# Patient Record
Sex: Female | Born: 1995 | Hispanic: Yes | Marital: Married | Smoking: Never smoker
Health system: Southern US, Community
[De-identification: ages and names within clinical notes are randomized; demographics above are authoritative.]

## PROBLEM LIST (undated history)

## (undated) ENCOUNTER — Inpatient Hospital Stay (HOSPITAL_COMMUNITY): Payer: Self-pay

## (undated) DIAGNOSIS — K2970 Gastritis, unspecified, without bleeding: Secondary | ICD-10-CM

## (undated) DIAGNOSIS — N83209 Unspecified ovarian cyst, unspecified side: Secondary | ICD-10-CM

## (undated) DIAGNOSIS — Z8759 Personal history of other complications of pregnancy, childbirth and the puerperium: Secondary | ICD-10-CM

## (undated) DIAGNOSIS — O99820 Streptococcus B carrier state complicating pregnancy: Secondary | ICD-10-CM

## (undated) HISTORY — PX: NO PAST SURGERIES: SHX2092

---
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
## 2017-07-31 ENCOUNTER — Encounter (HOSPITAL_COMMUNITY): Payer: Self-pay | Admitting: Emergency Medicine

## 2017-07-31 ENCOUNTER — Emergency Department (HOSPITAL_COMMUNITY)
Admission: EM | Admit: 2017-07-31 | Discharge: 2017-07-31 | Disposition: A | Discharge status: Home / Self Care | Payer: Medicaid Other | Attending: Emergency Medicine | Admitting: Emergency Medicine

## 2017-07-31 DIAGNOSIS — K2900 Acute gastritis without bleeding: Secondary | ICD-10-CM | POA: Diagnosis not present

## 2017-07-31 DIAGNOSIS — R1013 Epigastric pain: Secondary | ICD-10-CM | POA: Diagnosis present

## 2017-07-31 LAB — URINALYSIS, ROUTINE W REFLEX MICROSCOPIC
BILIRUBIN URINE: NEGATIVE
Glucose, UA: NEGATIVE mg/dL
HGB URINE DIPSTICK: NEGATIVE
Ketones, ur: NEGATIVE mg/dL
Nitrite: NEGATIVE
PH: 8 (ref 5.0–8.0)
PROTEIN: 30 mg/dL — AB
SPECIFIC GRAVITY, URINE: 1.02 (ref 1.005–1.030)

## 2017-07-31 LAB — I-STAT CHEM 8, ED
BUN: 7 mg/dL (ref 6–20)
CREATININE: 0.6 mg/dL (ref 0.44–1.00)
Calcium, Ion: 1.23 mmol/L (ref 1.15–1.40)
Chloride: 104 mmol/L (ref 101–111)
GLUCOSE: 86 mg/dL (ref 65–99)
HCT: 41 % (ref 36.0–46.0)
HEMOGLOBIN: 13.9 g/dL (ref 12.0–15.0)
POTASSIUM: 4.1 mmol/L (ref 3.5–5.1)
Sodium: 140 mmol/L (ref 135–145)
TCO2: 25 mmol/L (ref 22–32)

## 2017-07-31 LAB — I-STAT BETA HCG BLOOD, ED (MC, WL, AP ONLY)

## 2017-07-31 LAB — CBC
HCT: 40.3 % (ref 36.0–46.0)
Hemoglobin: 13.3 g/dL (ref 12.0–15.0)
MCH: 28.8 pg (ref 26.0–34.0)
MCHC: 33 g/dL (ref 30.0–36.0)
MCV: 87.2 fL (ref 78.0–100.0)
PLATELETS: 220 10*3/uL (ref 150–400)
RBC: 4.62 MIL/uL (ref 3.87–5.11)
RDW: 12.4 % (ref 11.5–15.5)
WBC: 6.7 10*3/uL (ref 4.0–10.5)

## 2017-07-31 LAB — COMPREHENSIVE METABOLIC PANEL
ALK PHOS: 65 U/L (ref 38–126)
ALT: 16 U/L (ref 14–54)
AST: 18 U/L (ref 15–41)
Albumin: 4 g/dL (ref 3.5–5.0)
Anion gap: 6 (ref 5–15)
BUN: 7 mg/dL (ref 6–20)
CALCIUM: 9.1 mg/dL (ref 8.9–10.3)
CHLORIDE: 105 mmol/L (ref 101–111)
CO2: 26 mmol/L (ref 22–32)
CREATININE: 0.58 mg/dL (ref 0.44–1.00)
GFR calc Af Amer: >60 mL/min (ref >60)
GFR calc non Af Amer: >60 mL/min (ref >60)
Glucose, Bld: 92 mg/dL (ref 65–99)
Potassium: 4.2 mmol/L (ref 3.5–5.1)
SODIUM: 137 mmol/L (ref 135–145)
Total Bilirubin: 0.6 mg/dL (ref 0.3–1.2)
Total Protein: 6.9 g/dL (ref 6.5–8.1)

## 2017-07-31 LAB — LIPASE, BLOOD: LIPASE: 36 U/L (ref 11–51)

## 2017-07-31 MED ORDER — GI COCKTAIL ~~LOC~~
30.0000 mL | Freq: Once | ORAL | Status: AC
  Administered 2017-07-31: 30 mL via ORAL
  Filled 2017-07-31: qty 30

## 2017-07-31 MED ORDER — ONDANSETRON HCL 4 MG PO TABS: 4.0000 mg | ORAL_TABLET | Freq: Four times a day (QID) | ORAL | 0 refills | Status: DC

## 2017-07-31 MED ORDER — OMEPRAZOLE 20 MG PO CPDR: 20.0000 mg | DELAYED_RELEASE_CAPSULE | Freq: Every day | ORAL | 0 refills | Status: DC

## 2017-07-31 NOTE — Discharge Instructions (Signed)
Please read attached information. If you experience any new or worsening signs or symptoms please return to the emergency room for evaluation. Please follow-up with your primary care provider or specialist as discussed. Please use medication prescribed only as directed and discontinue taking if you have any concerning signs or symptoms.   °

## 2017-07-31 NOTE — ED Provider Notes (Signed)
[facility] Provider Note   CSN: [id number] Arrival date & time: 07/31/17  1241     History   Chief Complaint Chief Complaint  Patient presents with  . Abdominal Pain  . Emesis    HPI Illene SilverChabelly Pearson is a 21 y.o. female.  HPI    21 year old female presents today with complaints of abdominal pain.  Patient reports for the last 3 days she has had epigastric abdominal pain.  She reports this is worse after eating, and also worse when she does need for prolonged periods of time.  She notes this has no radiation of symptoms, reports several episodes of vomiting, none since presentation to the emergency room.  She denies any lower abdominal pain, denies any drug or alcohol use, not taking any medications, denies any fever or systemic illnesses.  No history of the same.      History reviewed. No pertinent past medical history.  There are no active problems to display for this patient.   History reviewed. No pertinent surgical history.  OB History    No data available       Home Medications    Prior to Admission medications   Medication Sig Start Date End Date Taking? Authorizing Provider  omeprazole (PRILOSEC) 20 MG capsule Take 1 capsule (20 mg total) by mouth daily. 07/31/17   Deklin Bieler, Tinnie GensJeffrey, PA-C  ondansetron (ZOFRAN) 4 MG tablet Take 1 tablet (4 mg total) by mouth every 6 (six) hours. 07/31/17   Eyvonne MechanicHedges, Clem Wisenbaker, PA-C    Family History No family history on file.  Social History Social History  Substance Use Topics  . Smoking status: Never Smoker  . Smokeless tobacco: Never Used  . Alcohol use Yes     Comment: occ     Allergies   Patient has no known allergies.   Review of Systems Review of Systems  All other systems reviewed and are negative.    Physical Exam Updated Vital Signs BP 117/71   Pulse 76   Temp 98.5 F (36.9 C) (Oral)   LMP 07/21/2017 (Exact Date) Comment: pt rpeorts implanted birth contraol  removed 07/11/17, only spotting since  SpO2 100%   Physical Exam  Constitutional: She is oriented to person, place, and time. She appears well-developed and well-nourished.  HENT:  Head: Normocephalic and atraumatic.  Eyes: Pupils are equal, round, and reactive to light. Conjunctivae are normal. Right eye exhibits no discharge. Left eye exhibits no discharge. No scleral icterus.  Neck: Normal range of motion. No JVD present. No tracheal deviation present.  Pulmonary/Chest: Effort normal. No stridor.  Abdominal:  Epigastric tenderness to palpation, no right upper quadrant tenderness, remainder abdominal exam  Neurological: She is alert and oriented to person, place, and time. Coordination normal.  Psychiatric: She has a normal mood and affect. Her behavior is normal. Judgment and thought content normal.  Nursing note and vitals reviewed.    ED Treatments / Results  Labs (all labs ordered are listed, but only abnormal results are displayed) Labs Reviewed  URINALYSIS, ROUTINE W REFLEX MICROSCOPIC - Abnormal; Notable for the following:       Result Value   Protein, ur 30 (*)    Leukocytes, UA TRACE (*)    Bacteria, UA FEW (*)    Squamous Epithelial / LPF 0-5 (*)    All other components within normal limits  LIPASE, BLOOD  COMPREHENSIVE METABOLIC PANEL  CBC  I-STAT BETA HCG BLOOD, ED ([facility], [facility], [facility] ONLY)  I-STAT CHEM  8, ED    EKG  EKG Interpretation None       Radiology No results found.  Procedures Procedures (including critical care time)  Medications Ordered in ED Medications  gi cocktail (Maalox,Lidocaine,Donnatal) (30 mLs Oral Given 07/31/17 1558)     Initial Impression / Assessment and Plan / ED Course  I have reviewed the triage vital signs and the nursing notes.  Pertinent labs & imaging results that were available during my care of the patient were reviewed by me and considered in my medical decision making (see chart for details).    Labs:    Imaging:  Consults:  Therapeutics:  Discharge Meds:   Assessment/Plan: 21 year old female presents today with abdominal pain.  This is likely gastritis, question gastric ulcer in this patient.  She is well-appearing resting comfortably in exam bed.  She was given GI cocktail which improved her symptoms.  She has reassuring laboratory analysis, no right upper quadrant pain, low suspicion for colecystitis, cholelithiasis, or any other significant intra-abdominal pathology.  Patient will be started on PPI, she will follow-up as an outpatient with her primary care for ongoing evaluation and management.   Final Clinical Impressions(s) / ED Diagnoses   Final diagnoses:  Acute gastritis without hemorrhage, unspecified gastritis type    New Prescriptions New Prescriptions   OMEPRAZOLE (PRILOSEC) 20 MG CAPSULE    Take 1 capsule (20 mg total) by mouth daily.   ONDANSETRON (ZOFRAN) 4 MG TABLET    Take 1 tablet (4 mg total) by mouth every 6 (six) hours.     Eyvonne Mechanic, PA-C 07/31/17 1746    Bethann Berkshire, MD 08/01/17 [phone]

## 2017-07-31 NOTE — ED Triage Notes (Signed)
Pt c/o dizziness since MOnday, states, "I feel dizzy all the time, like I'm pregnant."  Pt reports light spotting, last normal menstruation in September.

## 2017-07-31 NOTE — ED Triage Notes (Signed)
Pt reports epigastric abdominal pain since Monday with emesis that began 2 days ago. Denies diarrhea, fevers, chills, urinary symptoms. 1 episode of emesis today.

## 2017-08-04 ENCOUNTER — Encounter: Payer: Self-pay | Admitting: Physician Assistant

## 2017-08-14 ENCOUNTER — Ambulatory Visit (INDEPENDENT_AMBULATORY_CARE_PROVIDER_SITE_OTHER): Payer: Medicaid Other | Admitting: Physician Assistant

## 2017-08-14 ENCOUNTER — Encounter: Payer: Self-pay | Admitting: Physician Assistant

## 2017-08-14 VITALS — BP 100/50 | HR 80 | Ht 64.5 in | Wt 172.0 lb

## 2017-08-14 DIAGNOSIS — R1013 Epigastric pain: Secondary | ICD-10-CM

## 2017-08-14 DIAGNOSIS — R112 Nausea with vomiting, unspecified: Secondary | ICD-10-CM

## 2017-08-14 MED ORDER — OMEPRAZOLE 20 MG PO CPDR: 20.0000 mg | DELAYED_RELEASE_CAPSULE | Freq: Every day | ORAL | 0 refills | Status: DC

## 2017-08-14 NOTE — Patient Instructions (Signed)
We have sent the following medications to your pharmacy for you to pick up at your convenience: Omeprazole 20 mg daily for one month

## 2017-08-14 NOTE — Progress Notes (Signed)
Agree with assessment and plan as outlined.  

## 2017-08-14 NOTE — Progress Notes (Signed)
Chief Complaint: Epigastric pain, nausea and vomiting  HPI:    [name] is a 21 year old female with no pertinent past medical history who was recently seen in the ER for acute gastritis and presents to clinic today as a new patient for follow-up.    Per review of chart patient was seen in the ER 07/31/17 with abdominal pain which was thought likely to be gastritis.  She improved with a GI cocktail and was given Omeprazole 20 mg once daily for a week as well as Zofran.  Labs at that time showed a normal lipase, CMP and CBC.    Today, the patient presents to clinic and explains that she took the medicine once a day for a week and now feels completely better over the past 3 days.  Patient tells me that prior to this about a month ago she started with some epigastric pain as well as nausea and some vomiting.  Patient also told me that she did feel "very full after I ate".  At this point in time patient is a longer having any symptoms. She denies NSAID use and is on a regular diet.    Patient denies fever, chills, blood in her stool, melena, weight loss, fatigue, anorexia, heartburn or reflux.  History reviewed. No pertinent past medical history.  Past Surgical History:  Procedure Laterality Date  . NO PAST SURGERIES      Current Outpatient Medications  Medication Sig Dispense Refill  . omeprazole (PRILOSEC) 20 MG capsule Take 1 capsule (20 mg total) daily by mouth. 30 capsule 0   No current facility-administered medications for this visit.     Allergies as of 08/14/2017  . (No Known Allergies)    History reviewed. No pertinent family history.  Social History   Socioeconomic History  . Marital status: Married    Spouse name: Not on file  . Number of children: 1  . Years of education: Not on file  . Highest education level: Not on file  Social Needs  . Financial resource strain: Not on file  . Food insecurity - worry: Not on file  . Food insecurity - inability: Not on file  .  Transportation needs - medical: Not on file  . Transportation needs - non-medical: Not on file  Occupational History  . Not on file  Tobacco Use  . Smoking status: Never Smoker  . Smokeless tobacco: Never Used  Substance and Sexual Activity  . Alcohol use: No    Frequency: Never  . Drug use: No  . Sexual activity: Not on file  Other Topics Concern  . Not on file  Social History Narrative  . Not on file    Review of Systems:    Constitutional: No weight loss, fever or chills Skin: No rash Cardiovascular: No chest pain  Respiratory: No SOB  Gastrointestinal: See HPI and otherwise negative Genitourinary: No dysuria Neurological: No headache Musculoskeletal: No new muscle or joint pain Hematologic: No bleeding  Psychiatric: No history of depression or anxiety   Physical Exam:  Vital signs: BP (!) 100/50 (BP Location: Left Arm, Patient Position: Sitting, Cuff Size: Normal)   Pulse 80   Ht 5' 4.5" (1.638 m) Comment: height measured without shoes  Wt 172 lb (78 kg)   LMP 07/21/2017 (Exact Date) Comment: pt reports implanted birth control removed 07/11/17, only spotting since  BMI 29.07 kg/m   Constitutional:   Pleasant female appears to be in NAD, Well developed, Well nourished, alert and cooperative Head:  Normocephalic and atraumatic. Eyes:   PEERL, EOMI. No icterus. Conjunctiva pink. Ears:  Normal auditory acuity. Neck:  Supple Throat: Oral cavity and pharynx without inflammation, swelling or lesion.  Respiratory: Respirations even and unlabored. Lungs clear to auscultation bilaterally.   No wheezes, crackles, or rhonchi.  Cardiovascular: Normal S1, S2. No MRG. Regular rate and rhythm. No peripheral edema, cyanosis or pallor.  Gastrointestinal:  Soft, nondistended, mild epigastric ttp,. No rebound or guarding. Normal bowel sounds. No appreciable masses or hepatomegaly. Rectal:  Not performed.  Msk:  Symmetrical without gross deformities. Without edema, no deformity or  joint abnormality.  Neurologic:  Alert and  oriented x4;  grossly normal neurologically.  Skin:   Dry and intact without significant lesions or rashes. Psychiatric: Demonstrates good judgement and reason without abnormal affect or behaviors.  RELEVANT LABS AND IMAGING: CBC    Component Value Date/Time   WBC 6.7 07/31/2017 1256   RBC 4.62 07/31/2017 1256   HGB 13.9 07/31/2017 1325   HCT 41.0 07/31/2017 1325   PLT 220 07/31/2017 1256   MCV 87.2 07/31/2017 1256   MCH 28.8 07/31/2017 1256   MCHC 33.0 07/31/2017 1256   RDW 12.4 07/31/2017 1256    CMP     Component Value Date/Time   NA 140 07/31/2017 1325   K 4.1 07/31/2017 1325   CL 104 07/31/2017 1325   CO2 26 07/31/2017 1256   GLUCOSE 86 07/31/2017 1325   BUN 7 07/31/2017 1325   CREATININE 0.60 07/31/2017 1325   CALCIUM 9.1 07/31/2017 1256   PROT 6.9 07/31/2017 1256   ALBUMIN 4.0 07/31/2017 1256   AST 18 07/31/2017 1256   ALT 16 07/31/2017 1256   ALKPHOS 65 07/31/2017 1256   BILITOT 0.6 07/31/2017 1256   GFRNONAA >60 07/31/2017 1256   GFRAA >60 07/31/2017 1256    Assessment: 1.  Epigastric pain: Continues today on exam, the patient tells me she has had no further symptoms for the past 3 days, these resolved after a week of Omeprazole 20 mg once daily; most likely this represented acute gastritis 2.  Nausea and vomiting: Resolved, likely due to above  Plan: 1.  Discussed etiology of symptoms with the patient.  Reviewed an antireflux diet and lifestyle modifications. 2.  Due to continued abdominal pain on exam today, prescribed 1 month of Omeprazole 20 mg once daily.  Patient may that then discontinue. 3.  Patient to follow in clinic as needed in the future.  She was assigned to Dr. Adela LankArmbruster today.  Hyacinth MeekerJennifer Zaire Vanbuskirk, PA-C [facility] 08/14/2017, 1:41 PM  Cc: No ref. provider found

## 2017-09-10 DIAGNOSIS — Z23 Encounter for immunization: Secondary | ICD-10-CM | POA: Diagnosis not present

## 2017-09-10 DIAGNOSIS — Z3009 Encounter for other general counseling and advice on contraception: Secondary | ICD-10-CM | POA: Diagnosis not present

## 2018-01-23 ENCOUNTER — Encounter (HOSPITAL_COMMUNITY): Payer: Self-pay

## 2018-01-23 ENCOUNTER — Emergency Department (HOSPITAL_COMMUNITY)
Admission: EM | Admit: 2018-01-23 | Discharge: 2018-01-24 | Disposition: A | Discharge status: Home / Self Care | Payer: Medicaid Other | Attending: Emergency Medicine | Admitting: Emergency Medicine

## 2018-01-23 DIAGNOSIS — X102XXA Contact with fats and cooking oils, initial encounter: Secondary | ICD-10-CM | POA: Insufficient documentation

## 2018-01-23 DIAGNOSIS — Y92000 Kitchen of unspecified non-institutional (private) residence as  the place of occurrence of the external cause: Secondary | ICD-10-CM | POA: Insufficient documentation

## 2018-01-23 DIAGNOSIS — Y999 Unspecified external cause status: Secondary | ICD-10-CM | POA: Insufficient documentation

## 2018-01-23 DIAGNOSIS — T23262A Burn of second degree of back of left hand, initial encounter: Secondary | ICD-10-CM | POA: Insufficient documentation

## 2018-01-23 DIAGNOSIS — Y93G3 Activity, cooking and baking: Secondary | ICD-10-CM | POA: Insufficient documentation

## 2018-01-23 MED ORDER — FENTANYL CITRATE (PF) 100 MCG/2ML IJ SOLN
50.0000 ug | INTRAMUSCULAR | Status: DC | PRN
  Administered 2018-01-23: 50 ug via NASAL
  Filled 2018-01-23: qty 2
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
## 2018-01-24 MED ORDER — ONDANSETRON 4 MG PO TBDP
4.0000 mg | ORAL_TABLET | Freq: Once | ORAL | Status: AC
  Administered 2018-01-24: 4 mg via ORAL
  Filled 2018-01-24: qty 1

## 2018-01-24 MED ORDER — SILVER SULFADIAZINE 1 % EX CREA
TOPICAL_CREAM | Freq: Once | CUTANEOUS | Status: DC
  Filled 2018-01-24: qty 85

## 2018-01-24 MED ORDER — BACITRACIN ZINC 500 UNIT/GM EX OINT
TOPICAL_OINTMENT | Freq: Once | CUTANEOUS | Status: AC
  Administered 2018-01-24: 1 via TOPICAL

## 2018-01-24 MED ORDER — DOXYCYCLINE HYCLATE 100 MG PO CAPS: 100.0000 mg | ORAL_CAPSULE | Freq: Two times a day (BID) | ORAL | 0 refills | Status: DC

## 2018-01-24 MED ORDER — OXYCODONE-ACETAMINOPHEN 5-325 MG PO TABS: 1.0000 | ORAL_TABLET | Freq: Four times a day (QID) | ORAL | 0 refills | Status: DC | PRN

## 2018-01-24 MED ORDER — BACITRACIN ZINC 500 UNIT/GM EX OINT: 1.0000 "application " | TOPICAL_OINTMENT | Freq: Two times a day (BID) | CUTANEOUS | 0 refills | Status: DC

## 2018-01-24 MED ORDER — IBUPROFEN 600 MG PO TABS: 600.0000 mg | ORAL_TABLET | Freq: Four times a day (QID) | ORAL | 0 refills | Status: DC | PRN

## 2018-01-24 MED ORDER — OXYCODONE-ACETAMINOPHEN 5-325 MG PO TABS
1.0000 | ORAL_TABLET | Freq: Once | ORAL | Status: AC
Start: 2018-01-24 — End: 2018-01-24
  Administered 2018-01-24: 1 via ORAL
  Filled 2018-01-24: qty 1

## 2018-01-24 MED ORDER — ONDANSETRON HCL 4 MG PO TABS: 4.0000 mg | ORAL_TABLET | Freq: Four times a day (QID) | ORAL | 0 refills | Status: DC

## 2018-01-24 NOTE — Progress Notes (Signed)
Orthopedic Tech Progress Note Patient Details:  Hannah Pearson 21-Jan-1996 [id number]  Ortho Devices Type of Ortho Device: Thumb velcro splint Ortho Device/Splint Location: delivered to pt. dr didnt want it applied. Ortho Device/Splint Interventions: Rich Brave 01/24/2018, 2:08 AM

## 2018-01-24 NOTE — Discharge Instructions (Addendum)
Medications: Percocet, ibuprofen, doxycycline, bacitracin  Treatment: Take 1 Percocet every 6 hours as needed for severe pain.  Make sure to take this with food.  Take Zofran every 6 hours as needed for nausea or vomiting.  Take ibuprofen every 6 hours as needed for mild to moderate pain.  Take doxycycline twice daily as prescribed.  Apply bacitracin and new dressing twice daily after washing your burn with soapy water.  You can use the splint for comfort as needed.  Do not drink alcohol, drive, operate machinery or participate in any other potentially dangerous activities while taking opiate pain medication as it may make you sleepy. Do not take this medication with any other sedating medications, either prescription or over-the-counter. If you were prescribed Percocet or Vicodin, do not take these with acetaminophen (Tylenol) as it is already contained within these medications and overdose of Tylenol is dangerous.   This medication is an opiate (or narcotic) pain medication and can be habit forming.  Use it as little as possible to achieve adequate pain control.  Do not use or use it with extreme caution if you have a history of opiate abuse or dependence. This medication is intended for your use only - do not give any to anyone else and keep it in a secure place where nobody else, especially children, have access to it. It will also cause or worsen constipation, so you may want to consider taking an over-the-counter stool softener while you are taking this medication.  Follow-up: Please follow-up with Dr. Amanda Pea at your scheduled appointment on Monday, 01/26/2018, at 2:30 PM.  Please return to the emergency department if you develop any new or worsening symptoms.
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
## 2018-07-05 ENCOUNTER — Encounter: Payer: Self-pay | Admitting: Emergency Medicine

## 2018-07-05 ENCOUNTER — Emergency Department (HOSPITAL_COMMUNITY)
Admission: EM | Admit: 2018-07-05 | Discharge: 2018-07-05 | Disposition: A | Discharge status: Home / Self Care | Payer: Self-pay | Attending: Emergency Medicine | Admitting: Emergency Medicine

## 2018-07-05 DIAGNOSIS — O9989 Other specified diseases and conditions complicating pregnancy, childbirth and the puerperium: Secondary | ICD-10-CM | POA: Insufficient documentation

## 2018-07-05 DIAGNOSIS — Z3A01 Less than 8 weeks gestation of pregnancy: Secondary | ICD-10-CM | POA: Insufficient documentation

## 2018-07-05 DIAGNOSIS — O219 Vomiting of pregnancy, unspecified: Secondary | ICD-10-CM | POA: Insufficient documentation

## 2018-07-05 DIAGNOSIS — R51 Headache: Secondary | ICD-10-CM | POA: Insufficient documentation

## 2018-07-05 DIAGNOSIS — R519 Headache, unspecified: Secondary | ICD-10-CM

## 2018-07-05 LAB — COMPREHENSIVE METABOLIC PANEL
ALT: 14 U/L (ref 0–44)
AST: 17 U/L (ref 15–41)
Albumin: 3.7 g/dL (ref 3.5–5.0)
Alkaline Phosphatase: 52 U/L (ref 38–126)
Anion gap: 7 (ref 5–15)
BUN: 5 mg/dL — ABNORMAL LOW (ref 6–20)
CHLORIDE: 105 mmol/L (ref 98–111)
CO2: 23 mmol/L (ref 22–32)
Calcium: 9.3 mg/dL (ref 8.9–10.3)
Creatinine, Ser: 0.56 mg/dL (ref 0.44–1.00)
Glucose, Bld: 89 mg/dL (ref 70–99)
POTASSIUM: 4.1 mmol/L (ref 3.5–5.1)
Sodium: 135 mmol/L (ref 135–145)
Total Bilirubin: 0.4 mg/dL (ref 0.3–1.2)
Total Protein: 6.6 g/dL (ref 6.5–8.1)

## 2018-07-05 LAB — CBC WITH DIFFERENTIAL/PLATELET
Abs Immature Granulocytes: 0.1 10*3/uL (ref 0.0–0.1)
Basophils Absolute: 0.1 10*3/uL (ref 0.0–0.1)
Basophils Relative: 1 %
EOS ABS: 0.1 10*3/uL (ref 0.0–0.7)
Eosinophils Relative: 1 %
HEMATOCRIT: 41.4 % (ref 36.0–46.0)
HEMOGLOBIN: 13.2 g/dL (ref 12.0–15.0)
Immature Granulocytes: 1 %
LYMPHS ABS: 1.9 10*3/uL (ref 0.7–4.0)
LYMPHS PCT: 19 %
MCH: 28.7 pg (ref 26.0–34.0)
MCHC: 31.9 g/dL (ref 30.0–36.0)
MCV: 90 fL (ref 78.0–100.0)
Monocytes Absolute: 0.7 10*3/uL (ref 0.1–1.0)
Monocytes Relative: 7 %
Neutro Abs: 7.2 10*3/uL (ref 1.7–7.7)
Neutrophils Relative %: 71 %
Platelets: 219 10*3/uL (ref 150–400)
RBC: 4.6 MIL/uL (ref 3.87–5.11)
RDW: 13.2 % (ref 11.5–15.5)
WBC: 10 10*3/uL (ref 4.0–10.5)

## 2018-07-05 MED ORDER — ACETAMINOPHEN 500 MG PO TABS
1000.0000 mg | ORAL_TABLET | Freq: Once | ORAL | Status: AC
  Administered 2018-07-05: 1000 mg via ORAL
  Filled 2018-07-05: qty 2

## 2018-07-05 MED ORDER — MECLIZINE HCL 25 MG PO TABS: 12.5000 mg | ORAL_TABLET | Freq: Two times a day (BID) | ORAL | 0 refills | Status: DC | PRN

## 2018-07-05 MED ORDER — CONCEPT OB 130-92.4-1 MG PO CAPS: 1.0000 | ORAL_CAPSULE | Freq: Every day | ORAL | 12 refills | Status: DC

## 2018-07-05 MED ORDER — SODIUM CHLORIDE 0.9 % IV BOLUS
1000.0000 mL | Freq: Once | INTRAVENOUS | Status: AC
  Administered 2018-07-05: 1000 mL via INTRAVENOUS

## 2018-07-05 MED ORDER — DIPHENHYDRAMINE HCL 50 MG/ML IJ SOLN
12.5000 mg | Freq: Once | INTRAMUSCULAR | Status: AC
  Administered 2018-07-05: 12.5 mg via INTRAVENOUS
  Filled 2018-07-05: qty 1

## 2018-07-05 MED ORDER — METOCLOPRAMIDE HCL 5 MG/ML IJ SOLN
5.0000 mg | Freq: Once | INTRAMUSCULAR | Status: AC
  Administered 2018-07-05: 5 mg via INTRAVENOUS
  Filled 2018-07-05: qty 2

## 2018-07-05 NOTE — ED Notes (Signed)
Patient verbalizes understanding of discharge instructions. Opportunity for questioning and answers were provided. Armband removed by staff, pt discharged from ED ambulatory.   

## 2018-07-05 NOTE — ED Triage Notes (Signed)
Pt reports headache x1 week, has not taken any medication because she is [redacted] weeks pregnant and unsure what she could safely take for pain. A/ox4, resp e/u, nad.

## 2018-07-05 NOTE — Discharge Instructions (Addendum)
Tylenol is the only safe over the counter medication for you to take during pregnancy for pain and headache. Please make sure to take prenatal vitamins at night before bed.  Please follow up closely with an obstetrician.   RETURN IMMEDIATELY IF you develop a sudden, severe headache or confusion, become poorly responsive or faint, develop a fever above 100.93F or problem breathing, have a change in speech, vision, swallowing, or understanding, or develop new weakness, numbness, tingling, incoordination, or have a seizure. Marland Kitchen

## 2018-07-05 NOTE — ED Provider Notes (Signed)
[facility] EMERGENCY DEPARTMENT Provider Note   CSN: [id number] Arrival date & time: 07/05/18  0940     History   Chief Complaint Chief Complaint  Patient presents with  . Headache    HPI Hannah Pearson is a 22 y.o. female.  Who presents the emergency department with chief complaint of headache and dizziness.  The patient states that she is approximately [redacted] weeks pregnant.  3 weeks ago she found out she was pregnant.  She has had multiple episodes of vomiting which are worsened after eating daily.  She is having symptoms of reflux.  She has had a daily intermittent aching global headache without photophobia, phonophobia.  She has no history of headaches previously.  She has not taken anything for the headache because she was not sure what she was able to take during pregnancy.  She is also complaining of dizziness when she moves her eyes.  She feels like the room is spinning.  She denies lightheadedness or orthostatic symptoms.  She has never had a previous diagnosis of vertigo.  She denies any other neurologic complaints at this time including unilateral weakness, difficulty with speech or swallowing, changes in vision, disequilibrium or ataxia.  The history is provided by the patient and a relative. The history is limited by a language barrier. A language interpreter was used.    History reviewed. No pertinent past medical history.  There are no active problems to display for this patient.   Past Surgical History:  Procedure Laterality Date  . NO PAST SURGERIES       OB History    Gravida  1   Para      Term      Preterm      AB      Living        SAB      TAB      Ectopic      Multiple      Live Births               Home Medications    Prior to Admission medications   Medication Sig Start Date End Date Taking? Authorizing Provider  prenatal vitamin w/FE, FA (PRENATAL 1 + 1) 27-1 MG TABS tablet Take 1 tablet by mouth daily.    Yes  [provider]  bacitracin ointment Apply 1 application topically 2 (two) times daily. Patient not taking: Reported on 07/05/2018 01/24/18   Emi Holes, PA-C  ibuprofen (ADVIL,MOTRIN) 600 MG tablet Take 1 tablet (600 mg total) by mouth every 6 (six) hours as needed. Patient not taking: Reported on 07/05/2018 01/24/18   Emi Holes, PA-C  omeprazole (PRILOSEC) 20 MG capsule Take 1 capsule (20 mg total) daily by mouth. Patient not taking: Reported on 07/05/2018 08/14/17   Unk Lightning, PA  ondansetron (ZOFRAN) 4 MG tablet Take 1 tablet (4 mg total) by mouth every 6 (six) hours. Patient not taking: Reported on 07/05/2018 01/24/18   Emi Holes, PA-C  oxyCODONE-acetaminophen (PERCOCET/ROXICET) 5-325 MG tablet Take 1 tablet by mouth every 6 (six) hours as needed for severe pain. Patient not taking: Reported on 07/05/2018 01/24/18   Emi Holes, PA-C    Family History No family history on file.  Social History Social History   Tobacco Use  . Smoking status: Never Smoker  . Smokeless tobacco: Never Used  Substance Use Topics  . Alcohol use: No    Frequency: Never  . Drug use: No  Allergies   Patient has no known allergies.   Review of Systems Review of Systems  Ten systems reviewed and are negative for acute change, except as noted in the HPI.   Physical Exam Updated Vital Signs BP 116/65 (BP Location: Right Arm)   Pulse 77   Temp 99.3 F (37.4 C) (Oral)   Resp 16   LMP 07/21/2017 (Exact Date) Comment: pt reports implanted birth control removed 07/11/17, only spotting since  SpO2 100%   Physical Exam Physical Exam  Constitutional: Pt is oriented to person, place, and time. Pt appears well-developed and well-nourished. No distress.  HENT:  Head: Normocephalic and atraumatic.  Mouth/Throat: Oropharynx is clear and moist.  Eyes: Conjunctivae and EOM are normal. Pupils are equal, round, and reactive to light. No scleral icterus.  No  horizontal, vertical or rotational nystagmus  Neck: Normal range of motion. Neck supple.  Full active and passive ROM without pain No midline or paraspinal tenderness No nuchal rigidity or meningeal signs  Cardiovascular: Normal rate, regular rhythm and intact distal pulses.   Pulmonary/Chest: Effort normal and breath sounds normal. No respiratory distress. Pt has no wheezes. No rales.  Abdominal: Soft. Bowel sounds are normal. There is no tenderness. There is no rebound and no guarding.  Musculoskeletal: Normal range of motion.  Lymphadenopathy:    No cervical adenopathy.  Neurological: Pt. is alert and oriented to person, place, and time. He has normal reflexes. No cranial nerve deficit.  Exhibits normal muscle tone. Coordination normal.  Mental Status:  Alert, oriented, thought content appropriate. Speech fluent without evidence of aphasia. Able to follow 2 step commands without difficulty.  Cranial Nerves:  II:  Peripheral visual fields grossly normal, pupils equal, round, reactive to light III,IV, VI: ptosis not present, extra-ocular motions intact bilaterally  V,VII: smile symmetric, facial light touch sensation equal VIII: hearing grossly normal bilaterally  IX,X: midline uvula rise  XI: bilateral shoulder shrug equal and strong XII: midline tongue extension  Motor:  5/5 in upper and lower extremities bilaterally including strong and equal grip strength and dorsiflexion/plantar flexion Sensory: Pinprick and light touch normal in all extremities.  Deep Tendon Reflexes: 2+ and symmetric  Cerebellar: normal finger-to-nose with bilateral upper extremities Gait: normal gait and balance CV: distal pulses palpable throughout   Skin: Skin is warm and dry. No rash noted. Pt is not diaphoretic.  Psychiatric: Pt has a normal mood and affect. Behavior is normal. Judgment and thought content normal.  Nursing note and vitals reviewed.    ED Treatments / Results  Labs (all labs ordered  are listed, but only abnormal results are displayed) Labs Reviewed - No data to display  EKG None  Radiology No results found.  Procedures Procedures (including critical care time)  Medications Ordered in ED Medications  acetaminophen (TYLENOL) tablet 1,000 mg (has no administration in time range)     Initial Impression / Assessment and Plan / ED Course  I have reviewed the triage vital signs and the nursing notes.  Pertinent labs & imaging results that were available during my care of the patient were reviewed by me and considered in my medical decision making (see chart for details).     22 year old female in early pregnancy with headache, nausea and vomiting.  History is gathered from the patient and her cousin who is at bedside.  Translation services utilized.  Do think she may have some element of peripheral vertigo as she is complaining of some room spinning sensation with eye movement  however she has a normal neurologic exam.  She has no neck stiffness, meningismus, rash or fever and I have no suspicion for meningitis.  Her symptoms resolved with Reglan Benadryl and fluids.  I also have very low suspicion for venous sinus thrombosis.. Discussed that should she continue to have a persistent headache that would not resolve with any medication she may need further imaging.  Patient given a small amount of meclizine which is low risk category in pregnancy and follow-up at the [facility]U for any emergent concern as well as outpatient OB/GYN.  She will also be given prescription for prenatal vitamins.  I discussed return precautions with patient appears appropriate for discharge at this time.  I reviewed the patient's labs which show no abnormalities. Final Clinical Impressions(s) / ED Diagnoses   Final diagnoses:  None    ED Discharge Orders    None       Arthor Captain, PA-C 07/05/18 1618    Long, Arlyss Repress, MD 07/05/18 1722

## 2018-08-13 ENCOUNTER — Other Ambulatory Visit (HOSPITAL_COMMUNITY): Payer: Self-pay | Admitting: Family

## 2018-08-13 DIAGNOSIS — Z1388 Encounter for screening for disorder due to exposure to contaminants: Secondary | ICD-10-CM | POA: Diagnosis not present

## 2018-08-13 DIAGNOSIS — R824 Acetonuria: Secondary | ICD-10-CM | POA: Diagnosis not present

## 2018-08-13 DIAGNOSIS — Z789 Other specified health status: Secondary | ICD-10-CM | POA: Diagnosis not present

## 2018-08-13 DIAGNOSIS — Z23 Encounter for immunization: Secondary | ICD-10-CM | POA: Diagnosis not present

## 2018-08-13 DIAGNOSIS — B373 Candidiasis of vulva and vagina: Secondary | ICD-10-CM | POA: Diagnosis not present

## 2018-08-13 DIAGNOSIS — Z369 Encounter for antenatal screening, unspecified: Secondary | ICD-10-CM

## 2018-08-13 DIAGNOSIS — Z3009 Encounter for other general counseling and advice on contraception: Secondary | ICD-10-CM | POA: Diagnosis not present

## 2018-08-13 DIAGNOSIS — Z3481 Encounter for supervision of other normal pregnancy, first trimester: Secondary | ICD-10-CM | POA: Diagnosis not present

## 2018-08-13 DIAGNOSIS — Z0389 Encounter for observation for other suspected diseases and conditions ruled out: Secondary | ICD-10-CM | POA: Diagnosis not present

## 2018-08-13 DIAGNOSIS — Z8751 Personal history of pre-term labor: Secondary | ICD-10-CM | POA: Diagnosis not present

## 2018-08-13 LAB — AMB REFERRAL TO OB-GYN
Drug Screen, Urine: NEGATIVE
Urine Culture, OB: NO GROWTH

## 2018-08-13 LAB — OB RESULTS CONSOLE RUBELLA ANTIBODY, IGM: RUBELLA: IMMUNE

## 2018-08-13 LAB — OB RESULTS CONSOLE GC/CHLAMYDIA
Chlamydia: NEGATIVE
GC PROBE AMP, GENITAL: NEGATIVE

## 2018-08-13 LAB — OB RESULTS CONSOLE HEPATITIS B SURFACE ANTIGEN: HEP B S AG: NEGATIVE

## 2018-08-13 LAB — OB RESULTS CONSOLE HGB/HCT, BLOOD
HEMATOCRIT: 39 (ref 36–46)
HEMOGLOBIN: 12.7 (ref 12.0–16.0)

## 2018-08-13 LAB — OB RESULTS CONSOLE ANTIBODY SCREEN: Antibody Screen: NEGATIVE

## 2018-08-13 LAB — OB RESULTS CONSOLE ABO/RH: RH TYPE: POSITIVE

## 2018-08-13 LAB — OB RESULTS CONSOLE RPR: RPR: NONREACTIVE

## 2018-08-13 LAB — DRUG SCREEN, URINE: DRUG SCREEN, URINE: NEGATIVE

## 2018-08-13 LAB — CYTOLOGY - PAP: Pap: NEGATIVE

## 2018-08-13 LAB — CULTURE, OB URINE: URINE CULTURE, OB: NO GROWTH

## 2018-08-13 LAB — OB RESULTS CONSOLE VARICELLA ZOSTER ANTIBODY, IGG: Varicella: IMMUNE

## 2018-08-13 LAB — OB RESULTS CONSOLE HIV ANTIBODY (ROUTINE TESTING): HIV: NONREACTIVE

## 2018-08-13 LAB — OB RESULTS CONSOLE PLATELET COUNT: Platelets: 257 (ref 150–399)

## 2018-08-19 ENCOUNTER — Encounter (HOSPITAL_COMMUNITY): Payer: Self-pay | Admitting: *Deleted

## 2018-08-20 ENCOUNTER — Encounter (HOSPITAL_COMMUNITY): Payer: Self-pay

## 2018-08-20 ENCOUNTER — Other Ambulatory Visit (HOSPITAL_COMMUNITY): Payer: Self-pay

## 2018-08-20 ENCOUNTER — Ambulatory Visit (HOSPITAL_COMMUNITY)
Admission: RE | Admit: 2018-08-20 | Discharge: 2018-08-20 | Disposition: A | Discharge status: Home / Self Care | Payer: Self-pay | Source: Ambulatory Visit | Attending: Family | Admitting: Family

## 2018-08-21 ENCOUNTER — Encounter (HOSPITAL_COMMUNITY): Payer: Self-pay

## 2018-08-21 ENCOUNTER — Other Ambulatory Visit (HOSPITAL_COMMUNITY): Payer: Self-pay

## 2018-08-21 ENCOUNTER — Encounter: Payer: Self-pay | Admitting: Emergency Medicine

## 2018-08-21 ENCOUNTER — Ambulatory Visit (HOSPITAL_COMMUNITY)
Admission: RE | Admit: 2018-08-21 | Discharge: 2018-08-21 | Disposition: A | Discharge status: Home / Self Care | Payer: Self-pay | Source: Ambulatory Visit | Attending: Family | Admitting: Family

## 2018-08-21 ENCOUNTER — Other Ambulatory Visit (HOSPITAL_COMMUNITY): Payer: Self-pay | Admitting: *Deleted

## 2018-08-21 DIAGNOSIS — Z3682 Encounter for antenatal screening for nuchal translucency: Secondary | ICD-10-CM | POA: Insufficient documentation

## 2018-08-21 DIAGNOSIS — O09213 Supervision of pregnancy with history of pre-term labor, third trimester: Secondary | ICD-10-CM

## 2018-08-21 DIAGNOSIS — O09293 Supervision of pregnancy with other poor reproductive or obstetric history, third trimester: Secondary | ICD-10-CM

## 2018-08-21 DIAGNOSIS — Z3A13 13 weeks gestation of pregnancy: Secondary | ICD-10-CM | POA: Insufficient documentation

## 2018-08-21 DIAGNOSIS — Z362 Encounter for other antenatal screening follow-up: Secondary | ICD-10-CM

## 2018-08-21 HISTORY — DX: Gastritis, unspecified, without bleeding: K29.70

## 2018-08-26 ENCOUNTER — Encounter: Payer: Self-pay | Admitting: *Deleted

## 2018-08-26 DIAGNOSIS — Z8751 Personal history of pre-term labor: Secondary | ICD-10-CM | POA: Insufficient documentation

## 2018-08-26 DIAGNOSIS — O0990 Supervision of high risk pregnancy, unspecified, unspecified trimester: Secondary | ICD-10-CM | POA: Insufficient documentation

## 2018-08-26 DIAGNOSIS — Z8759 Personal history of other complications of pregnancy, childbirth and the puerperium: Secondary | ICD-10-CM | POA: Insufficient documentation

## 2018-08-26 HISTORY — DX: Personal history of other complications of pregnancy, childbirth and the puerperium: Z87.59

## 2018-09-01 ENCOUNTER — Encounter: Payer: Self-pay | Admitting: Family Medicine

## 2018-09-15 ENCOUNTER — Emergency Department (HOSPITAL_COMMUNITY): Payer: Self-pay

## 2018-09-15 ENCOUNTER — Other Ambulatory Visit: Payer: Self-pay

## 2018-09-15 ENCOUNTER — Emergency Department (HOSPITAL_COMMUNITY)
Admission: EM | Admit: 2018-09-15 | Discharge: 2018-09-15 | Disposition: A | Discharge status: Home / Self Care | Payer: Self-pay | Attending: Emergency Medicine | Admitting: Emergency Medicine

## 2018-09-15 ENCOUNTER — Encounter (HOSPITAL_COMMUNITY): Payer: Self-pay

## 2018-09-15 DIAGNOSIS — Z79899 Other long term (current) drug therapy: Secondary | ICD-10-CM | POA: Insufficient documentation

## 2018-09-15 DIAGNOSIS — R0789 Other chest pain: Secondary | ICD-10-CM | POA: Insufficient documentation

## 2018-09-15 MED ORDER — ACETAMINOPHEN 500 MG PO TABS
500.0000 mg | ORAL_TABLET | Freq: Once | ORAL | Status: AC
  Administered 2018-09-15: 500 mg via ORAL
  Filled 2018-09-15: qty 1

## 2018-09-15 NOTE — Discharge Instructions (Addendum)
As discussed, your evaluation today has been largely reassuring.  But, it is important that you monitor your condition carefully, and do not hesitate to return to the ED if you develop new, or concerning changes in your condition. ? ?Otherwise, please follow-up with your physician for appropriate ongoing care. ? ?

## 2018-09-15 NOTE — ED Notes (Signed)
Pt stable, ambulatory, states understanding of discharge instructions 

## 2018-09-15 NOTE — ED Notes (Signed)
Patient transported to X-ray 

## 2018-09-15 NOTE — ED Provider Notes (Signed)
[facility] EMERGENCY DEPARTMENT Provider Note   CSN: [id number] Arrival date & time: 09/15/18  1052     History   Chief Complaint Chief Complaint  Patient presents with  . Chest Pain    HPI Hannah Pearson is a 22 y.o. female.  HPI Patient presents with chest pain. Onset was perhaps yesterday, pain is intermittent, occurring for brief moments, without associated dyspnea, fever, chills, cough. Patient is pregnant, notes her pregnancy is going generally well. No vaginal complaints, no abdominal pain that is persistent. No medication taken for pain relief.  Past Medical History:  Diagnosis Date  . Gastritis     Patient Active Problem List   Diagnosis Date Noted  . Supervision of high risk pregnancy, antepartum 08/26/2018  . History of preterm delivery 08/26/2018  . History of prior pregnancy with IUGR newborn 08/26/2018    Past Surgical History:  Procedure Laterality Date  . NO PAST SURGERIES       OB History    Gravida  2   Para  1   Term  0   Preterm  1   AB  0   Living  1     SAB  0   TAB  0   Ectopic  0   Multiple      Live Births               Home Medications    Prior to Admission medications   Medication Sig Start Date End Date Taking? Authorizing Provider  bacitracin ointment Apply 1 application topically 2 (two) times daily. Patient not taking: Reported on 07/05/2018 01/24/18   Emi Holes, PA-C  ibuprofen (ADVIL,MOTRIN) 600 MG tablet Take 1 tablet (600 mg total) by mouth every 6 (six) hours as needed. Patient not taking: Reported on 07/05/2018 01/24/18   Emi Holes, PA-C  meclizine (ANTIVERT) 25 MG tablet Take 0.5-1 tablets (12.5-25 mg total) by mouth 2 (two) times daily as needed for dizziness or nausea. 07/05/18   Arthor Captain, PA-C  omeprazole (PRILOSEC) 20 MG capsule Take 1 capsule (20 mg total) daily by mouth. Patient not taking: Reported on 07/05/2018 08/14/17   Unk Lightning, PA  ondansetron (ZOFRAN) 4 MG tablet Take 1 tablet (4 mg total) by mouth every 6 (six) hours. Patient not taking: Reported on 07/05/2018 01/24/18   Emi Holes, PA-C  oxyCODONE-acetaminophen (PERCOCET/ROXICET) 5-325 MG tablet Take 1 tablet by mouth every 6 (six) hours as needed for severe pain. Patient not taking: Reported on 07/05/2018 01/24/18   Emi Holes, PA-C  Prenat w/o A Vit-FeFum-FePo-FA (CONCEPT OB) 130-92.4-1 MG CAPS Take 1 capsule by mouth daily. 07/05/18   Arthor Captain, PA-C  Prenatal Vit w/Fe-Methylfol-FA (PNV PO) Take by mouth.    [provider]  prenatal vitamin w/FE, FA (PRENATAL 1 + 1) 27-1 MG TABS tablet Take 1 tablet by mouth daily.     [provider]    Family History No family history on file.  Social History Social History   Tobacco Use  . Smoking status: Never Smoker  . Smokeless tobacco: Never Used  Substance Use Topics  . Alcohol use: Not Currently    Frequency: Never  . Drug use: Not Currently     Allergies   Patient has no known allergies.   Review of Systems Review of Systems  Constitutional:       Per HPI, otherwise negative  HENT:       Per HPI,  otherwise negative  Respiratory:       Per HPI, otherwise negative  Cardiovascular:       Per HPI, otherwise negative  Gastrointestinal: Negative for vomiting.  Endocrine:       Negative aside from HPI  Genitourinary:       Neg aside from HPI   Musculoskeletal:       Per HPI, otherwise negative  Skin: Negative.   Neurological: Negative for syncope.     Physical Exam Updated Vital Signs BP 107/68   Pulse 70   Temp 98 F (36.7 C) (Oral)   Resp (!) 22   Ht 5\' 5"  (1.651 m)   Wt 78.9 kg   LMP 05/18/2018   SpO2 100%   BMI 28.96 kg/m   Physical Exam Vitals signs and nursing note reviewed.  Constitutional:      General: She is not in acute distress.    Appearance: She is well-developed.  HENT:     Head: Normocephalic and atraumatic.    Eyes:     Conjunctiva/sclera: Conjunctivae normal.  Cardiovascular:     Rate and Rhythm: Normal rate and regular rhythm.  Pulmonary:     Effort: Pulmonary effort is normal. No respiratory distress.     Breath sounds: Normal breath sounds. No stridor. No decreased breath sounds or wheezing.  Chest:     Comments: No appreciable tenderness anywhere, no deformity Abdominal:     General: There is no distension.     Palpations: Abdomen is soft.     Tenderness: There is no abdominal tenderness. There is no guarding or rebound.  Skin:    General: Skin is warm and dry.  Neurological:     Mental Status: She is alert and oriented to person, place, and time.     Cranial Nerves: No cranial nerve deficit.      ED Treatments / Results   EKG EKG Interpretation  Date/Time:  Tuesday September 15 2018 11:03:50 EST Ventricular Rate:  74 PR Interval:    QRS Duration: 80 QT Interval:  372 QTC Calculation: 413 R Axis:   51 Text Interpretation:  Sinus rhythm unremarkable ecg Confirmed by Gerhard MunchLockwood, Malik Ruffino [phone]([id number]) on 09/15/2018 12:32:24 PM   Radiology Dg Chest 2 View  Result Date: 09/15/2018 CLINICAL DATA:  Nonradiating chest pain. EXAM: CHEST - 2 VIEW COMPARISON:  None. FINDINGS: The heart size and mediastinal contours are within normal limits. Both lungs are clear. The visualized skeletal structures are unremarkable. IMPRESSION: No active cardiopulmonary disease. Electronically Signed   By: Gerome Samavid  Williams III M.D   On: 09/15/2018 11:55    Procedures Procedures (including critical care time)  Medications Ordered in ED Medications  acetaminophen (TYLENOL) tablet 500 mg (500 mg Oral Given 09/15/18 1201)     Initial Impression / Assessment and Plan / ED Course  I have reviewed the triage vital signs and the nursing notes.  Pertinent labs & imaging results that were available during my care of the patient were reviewed by me and considered in my medical decision making (see chart for  details).  12:33 PM On repeat exam patient is in no distress, sitting upright, speaking clearly. We discussed findings, reassuring, no evidence for ACS, PE, with no hypoxia, increased work of breathing, no evidence for pneumonia. With unremarkable vital signs aside from borderline low blood pressure, consistent with pregnancy, the patient will follow-up with her outpatient providers.    Final Clinical Impressions(s) / ED Diagnoses   Final diagnoses:  Atypical chest pain  Gerhard Munch, MD 09/15/18 1233

## 2018-09-15 NOTE — ED Triage Notes (Signed)
Pt endorses CP that began Sunday. Pt is [redacted] weeks pregnant. Pt endorses pain is right sided, constant, and 8/10. Pt denies any N/V.

## 2018-09-15 NOTE — ED Notes (Signed)
ED Provider at bedside. 

## 2018-10-01 ENCOUNTER — Ambulatory Visit (INDEPENDENT_AMBULATORY_CARE_PROVIDER_SITE_OTHER): Payer: Self-pay | Admitting: Obstetrics and Gynecology

## 2018-10-01 ENCOUNTER — Encounter: Payer: Self-pay | Admitting: Obstetrics and Gynecology

## 2018-10-01 VITALS — BP 102/52 | HR 87 | Wt 176.9 lb

## 2018-10-01 DIAGNOSIS — O0992 Supervision of high risk pregnancy, unspecified, second trimester: Secondary | ICD-10-CM

## 2018-10-01 DIAGNOSIS — O0990 Supervision of high risk pregnancy, unspecified, unspecified trimester: Secondary | ICD-10-CM

## 2018-10-01 DIAGNOSIS — Z8759 Personal history of other complications of pregnancy, childbirth and the puerperium: Secondary | ICD-10-CM

## 2018-10-01 MED ORDER — PREPLUS 27-1 MG PO TABS: 1.0000 | ORAL_TABLET | Freq: Every day | ORAL | 13 refills | Status: DC

## 2018-10-01 NOTE — Progress Notes (Signed)
Subjective:  Shariyah Nelles is a 23 y.o. G3P1101 at [redacted]w[redacted]d being seen today for ongoing prenatal care.Transfered from [facility]. H/O IOL labor at 89 1/2 weeks d/t to IUGR.   She is currently monitored for the following issues for this high-risk pregnancy and has Supervision of high risk pregnancy, antepartum and History of prior pregnancy with IUGR newborn on their problem list.  Patient reports no complaints.  Contractions: Not present. Vag. Bleeding: None.  Movement: Present. Denies leaking of fluid.   The following portions of the patient's history were reviewed and updated as appropriate: allergies, current medications, past family history, past medical history, past social history, past surgical history and problem list. Problem list updated.  Objective:   Vitals:   10/01/18 0842  BP: (!) 102/52  Pulse: 87  Weight: 176 lb 14.4 oz (80.2 kg)    Fetal Status: Fetal Heart Rate (bpm): 161   Movement: Present     General:  Alert, oriented and cooperative. Patient is in no acute distress.  Skin: Skin is warm and dry. No rash noted.   Cardiovascular: Normal heart rate noted  Respiratory: Normal respiratory effort, no problems with respiration noted  Abdomen: Soft, gravid, appropriate for gestational age. Pain/Pressure: Absent     Pelvic:  Cervical exam deferred        Extremities: Normal range of motion.  Edema: None  Mental Status: Normal mood and affect. Normal behavior. Normal judgment and thought content.   Urinalysis:      Assessment and Plan:  Pregnancy: G3P1101 at [redacted]w[redacted]d  1. Supervision of high risk pregnancy, antepartum Stable Prenatal labs, pap and U/S completed at [facility] Prenatal care reviewed with pt 2. History of prior pregnancy with IUGR newborn Reviewed with pt. Anatomy scan completed, F/U scan has been ordered. Medical records requested Not a candidate for 17 OHP per history  Preterm labor symptoms and general obstetric precautions including but not  limited to vaginal bleeding, contractions, leaking of fluid and fetal movement were reviewed in detail with the patient. Please refer to After Visit Summary for other counseling recommendations.  Return in about 4 weeks (around 10/29/2018) for OB visit.   Hermina Staggers, MD

## 2018-10-09 ENCOUNTER — Encounter (HOSPITAL_COMMUNITY): Payer: Self-pay

## 2018-10-09 ENCOUNTER — Ambulatory Visit (HOSPITAL_COMMUNITY)
Admission: RE | Admit: 2018-10-09 | Discharge: 2018-10-09 | Disposition: A | Discharge status: Home / Self Care | Payer: Medicaid Other | Source: Ambulatory Visit | Attending: Family | Admitting: Family

## 2018-10-09 DIAGNOSIS — Z3A20 20 weeks gestation of pregnancy: Secondary | ICD-10-CM | POA: Diagnosis not present

## 2018-10-09 DIAGNOSIS — Z363 Encounter for antenatal screening for malformations: Secondary | ICD-10-CM | POA: Diagnosis not present

## 2018-10-09 DIAGNOSIS — O09292 Supervision of pregnancy with other poor reproductive or obstetric history, second trimester: Secondary | ICD-10-CM | POA: Diagnosis not present

## 2018-10-09 DIAGNOSIS — O0990 Supervision of high risk pregnancy, unspecified, unspecified trimester: Secondary | ICD-10-CM | POA: Insufficient documentation

## 2018-10-09 DIAGNOSIS — Z362 Encounter for other antenatal screening follow-up: Secondary | ICD-10-CM

## 2018-10-09 DIAGNOSIS — O359XX0 Maternal care for (suspected) fetal abnormality and damage, unspecified, not applicable or unspecified: Secondary | ICD-10-CM | POA: Diagnosis not present

## 2018-10-28 ENCOUNTER — Other Ambulatory Visit (HOSPITAL_COMMUNITY): Payer: Self-pay | Admitting: *Deleted

## 2018-10-28 ENCOUNTER — Inpatient Hospital Stay (HOSPITAL_COMMUNITY)
Admission: AD | Admit: 2018-10-28 | Discharge: 2018-10-28 | Disposition: A | Discharge status: Home / Self Care | Payer: Medicaid Other | Attending: Obstetrics and Gynecology | Admitting: Obstetrics and Gynecology

## 2018-10-28 ENCOUNTER — Other Ambulatory Visit: Payer: Self-pay

## 2018-10-28 ENCOUNTER — Encounter (HOSPITAL_COMMUNITY): Payer: Self-pay | Admitting: *Deleted

## 2018-10-28 DIAGNOSIS — O4692 Antepartum hemorrhage, unspecified, second trimester: Secondary | ICD-10-CM

## 2018-10-28 DIAGNOSIS — Z6790 Unspecified blood type, Rh positive: Secondary | ICD-10-CM | POA: Diagnosis not present

## 2018-10-28 DIAGNOSIS — O26892 Other specified pregnancy related conditions, second trimester: Secondary | ICD-10-CM | POA: Diagnosis not present

## 2018-10-28 DIAGNOSIS — Z362 Encounter for other antenatal screening follow-up: Secondary | ICD-10-CM

## 2018-10-28 DIAGNOSIS — Z3A23 23 weeks gestation of pregnancy: Secondary | ICD-10-CM

## 2018-10-28 LAB — URINALYSIS, ROUTINE W REFLEX MICROSCOPIC
Bilirubin Urine: NEGATIVE
Glucose, UA: NEGATIVE mg/dL
Hgb urine dipstick: NEGATIVE
Ketones, ur: NEGATIVE mg/dL
Leukocytes, UA: NEGATIVE
Nitrite: NEGATIVE
Protein, ur: NEGATIVE mg/dL
Specific Gravity, Urine: 1.02 (ref 1.005–1.030)
pH: 8 (ref 5.0–8.0)

## 2018-10-28 LAB — WET PREP, GENITAL
Sperm: NONE SEEN
TRICH WET PREP: NONE SEEN
Yeast Wet Prep HPF POC: NONE SEEN

## 2018-10-28 NOTE — MAU Note (Signed)
Pt states she went to [facility] this morning noted bleeding on toilet tissue & some in toilet.  Denies pain.  Reports good fetal movement.

## 2018-10-28 NOTE — MAU Provider Note (Signed)
History     CSN: [id number]  Arrival date and time: 10/28/18 2119   First Provider Initiated Contact with Patient 10/28/18 [phone]      Chief Complaint  Patient presents with  . Vaginal Bleeding   G2P1001 @23 .2 wks presenting with VB. Reports seeing a smear of blood on the toilet paper and a drop in the toilet about 2 hrs ago. No bleeding since. No recent IC. No abd pain. Denies ctx and LOF. Reports good FM.    OB History    Gravida  2   Para  1   Term  1   Preterm  0   AB  0   Living  1     SAB  0   TAB  0   Ectopic  0   Multiple      Live Births              Past Medical History:  Diagnosis Date  . Gastritis     Past Surgical History:  Procedure Laterality Date  . NO PAST SURGERIES      History reviewed. No pertinent family history.  Social History   Tobacco Use  . Smoking status: Never Smoker  . Smokeless tobacco: Never Used  Substance Use Topics  . Alcohol use: Not Currently    Frequency: Never  . Drug use: Not Currently    Allergies: No Known Allergies  Medications Prior to Admission  Medication Sig Dispense Refill Last Dose  . Prenatal Vit-Fe Fumarate-FA (PREPLUS) 27-1 MG TABS Take 1 tablet by mouth daily. 30 tablet 13 10/27/2018 at Unknown time    Review of Systems  Gastrointestinal: Negative for abdominal pain.  Genitourinary: Positive for vaginal bleeding.   Physical Exam   Blood pressure (!) 101/55, pulse 85, temperature 98.2 F (36.8 C), temperature source Oral, resp. rate 19, height 5\' 5"  (1.651 m), weight 83 kg, last menstrual period 05/18/2018, SpO2 98 %.  Physical Exam  Nursing note and vitals reviewed. Constitutional: She is oriented to person, place, and time. She appears well-developed and well-nourished. No distress.  HENT:  Head: Normocephalic and atraumatic.  Neck: Normal range of motion.  Cardiovascular: Normal rate.  Respiratory: Effort normal. No respiratory distress.  GI: Soft. She exhibits no  distension. There is no abdominal tenderness.  gravid  Genitourinary:    Genitourinary Comments: External: no lesions or erythema Vagina: rugated, pink, moist, small pink creamy discharge Cervix closed/thick    Musculoskeletal: Normal range of motion.  Neurological: She is alert and oriented to person, place, and time.  Skin: Skin is warm and dry.  Psychiatric: She has a normal mood and affect.  EFM: 150 bpm, mod variability, + accels, rare variable decel Toco: none  Results for orders placed or performed during the hospital encounter of 10/28/18 (from the past 24 hour(s))  Wet prep, genital     Status: Abnormal   Collection Time: 10/28/18  9:16 AM  Result Value Ref Range   Yeast Wet Prep HPF POC NONE SEEN NONE SEEN   Trich, Wet Prep NONE SEEN NONE SEEN   Clue Cells Wet Prep HPF POC PRESENT (A) NONE SEEN   WBC, Wet Prep HPF POC MANY (A) NONE SEEN   Sperm NONE SEEN   Urinalysis, Routine w reflex microscopic     Status: None   Collection Time: 10/28/18  9:34 AM  Result Value Ref Range   Color, Urine YELLOW YELLOW   APPearance CLEAR CLEAR   Specific Gravity, Urine 1.020 1.005 -  1.030   pH 8.0 5.0 - 8.0   Glucose, UA NEGATIVE NEGATIVE mg/dL   Hgb urine dipstick NEGATIVE NEGATIVE   Bilirubin Urine NEGATIVE NEGATIVE   Ketones, ur NEGATIVE NEGATIVE mg/dL   Protein, ur NEGATIVE NEGATIVE mg/dL   Nitrite NEGATIVE NEGATIVE   Leukocytes, UA NEGATIVE NEGATIVE   MAU Course  Procedures Orders Placed This Encounter  Procedures  . Wet prep, genital    Standing Status:   Standing    Number of Occurrences:   1    Order Specific Question:   Patient immune status    Answer:   Normal  . Urinalysis, Routine w reflex microscopic    Standing Status:   Standing    Number of Occurrences:   1  . Discharge patient    Order Specific Question:   Discharge disposition    Answer:   01-Home or Self Care [1]    Order Specific Question:   Discharge patient date    Answer:   10/28/2018   MDM Chart  review: previous US shows no previa or LLP. Fetal status reassuring. No evidence of PTL or abruption. Unclear etiology of bleeding. GC pending. Stable for discharge home.  Assessment and Plan   1. [redacted] weeks gestation of pregnancy   2. Vaginal bleeding in pregnancy, second trimester   3. Blood type, Rh positive    Discharge home Follow up in [facility] tomorrow as scheduled PTL/bleeding precautions Pelvic rest  Allergies as of 10/28/2018   No Known Allergies     Medication List    TAKE these medications   PREPLUS 27-1 MG Tabs Take 1 tablet by mouth daily.       Donette Larry, CNM 10/28/2018, 10:07 AM

## 2018-10-28 NOTE — Discharge Instructions (Signed)
Hemorragia vaginal durante el segundo trimestre de embarazo   Vaginal Bleeding During Pregnancy, Second Trimester    Durante el embarazo es relativamente comn que se presente un pequeo sangrado (prdidas) proveniente de la vagina. Normalmente esto se detiene por s solo. Hay diversos factores que pueden causar prdidas en el embarazo. A veces, la hemorragia es normal y no es signo de un problema en el embarazo. Sin embargo, la hemorragia tambin puede ser un signo de algo grave. Debe informar a su mdico de inmediato si tiene alguna hemorragia vaginal.  Algunas causas posibles de hemorragia vaginal durante el segundo trimestre incluyen lo siguiente:   Infeccin, inflamacin o crecimientos anmalos (plipos) en el cuello uterino.   Una afeccin por la cual la placenta cubre parcial o completamente la abertura del cuello uterino (placenta previa).   La placenta se separa del tero (desprendimiento de placenta).   Trabajo de parto temprano (prematuro).   Se abre y se afina el cuello uterino antes que el embarazo llegue a trmino y antes de comenzar el trabajo de parto ( cuello uterino incompetente).   Una masa de tejido que crece en el tero debido a una mala fertilizacin del vulo (embarazo molar).  Siga estas indicaciones en su casa:  Actividad   Siga las indicaciones del mdico respecto de las restricciones en las actividades. Pregunte qu actividades son seguras para usted.   Si es necesario, organcese para que alguien la ayude con las actividades cotidianas.   No haga ejercicios ni actividades que requieran mucho esfuerzo hasta que su mdico lo autorice.   No levante ningn objeto que pese ms de 10libras (4,5kg) o el lmite de peso que le indique su mdico hasta que l le diga que puede hacerlo.   No tenga relaciones sexuales ni orgasmos hasta que su mdico le diga que es seguro.  Medicamentos   Tome los medicamentos de venta libre y los recetados solamente como se lo haya indicado el  mdico.   No tome aspirina, ya que puede causar hemorragias.  Instrucciones generales   Est atenta a cualquier cambio en los sntomas.   Lleve un registro de la cantidad de apsitos higinicos que usa por da, la frecuencia con la que los cambia y cun empapados (saturados) estn.   No use tampones ni se haga duchas vaginales.   Si elimina tejido por la vagina, gurdelo para mostrrselo al mdico.   Concurra a todas las visitas de control como se lo haya indicado el mdico. Esto es importante.  Comunquese con un mdico si:   Tiene un sangrado vaginal en cualquier momento del embarazo.   Tiene calambres o dolores de parto.   Tiene fiebre que no baja cuando toma medicamentos.  Solicite ayuda de inmediato si:   Tiene clicos intensos en la espalda o el abdomen.   Siente contracciones.   Tiene escalofros.   Elimina cogulos grandes o gran cantidad de tejido por la vagina.   La hemorragia aumenta.   Se siente mareada, dbil, o se desmaya.   Tiene una prdida importante o sale lquido a borbotones por la vagina.  Resumen   Hay diversos factores que pueden causar hemorragia o prdidas en el embarazo.   Debe informar a su mdico de inmediato si tiene alguna hemorragia vaginal.   Siga las indicaciones del mdico respecto de las restricciones en las actividades. Pregunte qu actividades son seguras para usted.  Esta informacin no tiene como fin reemplazar el consejo del mdico. Asegrese de hacerle al mdico cualquier pregunta 

## 2018-10-29 ENCOUNTER — Ambulatory Visit (INDEPENDENT_AMBULATORY_CARE_PROVIDER_SITE_OTHER): Payer: Self-pay | Admitting: Obstetrics and Gynecology

## 2018-10-29 VITALS — BP 104/55 | HR 79 | Wt 184.2 lb

## 2018-10-29 DIAGNOSIS — Z3480 Encounter for supervision of other normal pregnancy, unspecified trimester: Secondary | ICD-10-CM

## 2018-10-29 DIAGNOSIS — Z3482 Encounter for supervision of other normal pregnancy, second trimester: Secondary | ICD-10-CM

## 2018-10-29 LAB — GC/CHLAMYDIA PROBE AMP (~~LOC~~) NOT AT ARMC
Chlamydia: NEGATIVE
Neisseria Gonorrhea: NEGATIVE

## 2018-10-29 NOTE — Progress Notes (Signed)
Prenatal Visit Note Date: 10/29/2018 Clinic: [facility]  Subjective:  Hannah Pearson is a 23 y.o. G2P1001 at [redacted]w[redacted]d being seen today for ongoing prenatal care.  She is currently monitored for the following issues for this low-risk pregnancy and has Supervision of high risk pregnancy, antepartum and History of prior pregnancy with IUGR newborn on their problem list.  Patient reports no complaints.   Contractions: Not present. Vag. Bleeding: None.  Movement: Present. Denies leaking of fluid.   The following portions of the patient's history were reviewed and updated as appropriate: allergies, current medications, past family history, past medical history, past social history, past surgical history and problem list. Problem list updated.  Objective:   Vitals:   10/29/18 1540  BP: (!) 104/55  Pulse: 79  Weight: 184 lb 3.2 oz (83.6 kg)    Fetal Status: Fetal Heart Rate (bpm): 153 Fundal Height: 23 cm Movement: Present     General:  Alert, oriented and cooperative. Patient is in no acute distress.  Skin: Skin is warm and dry. No rash noted.   Cardiovascular: Normal heart rate noted  Respiratory: Normal respiratory effort, no problems with respiration noted  Abdomen: Soft, gravid, appropriate for gestational age. Pain/Pressure: Absent     Pelvic:  Cervical exam deferred        Extremities: Normal range of motion.  Edema: None  Mental Status: Normal mood and affect. Normal behavior. Normal judgment and thought content.   Urinalysis:      Assessment and Plan:  Pregnancy: G2P1001 at [redacted]w[redacted]d  No bleeding since MAU visit yesterday. Precautions given. Doing well. Has [facility] surveillance growth u/s already scheduled in next 1-2wks.  Preterm labor symptoms and general obstetric precautions including but not limited to vaginal bleeding, contractions, leaking of fluid and fetal movement were reviewed in detail with the patient. Please refer to After Visit  Summary for other counseling recommendations.  Return in about 4 weeks (around 11/26/2018) for hrob and 2hr GTT.   Parrott Bing, MD

## 2018-11-06 ENCOUNTER — Ambulatory Visit (HOSPITAL_COMMUNITY)
Admission: RE | Admit: 2018-11-06 | Discharge: 2018-11-06 | Disposition: A | Discharge status: Home / Self Care | Payer: Medicaid Other | Source: Ambulatory Visit | Attending: Obstetrics and Gynecology | Admitting: Obstetrics and Gynecology

## 2018-11-06 ENCOUNTER — Encounter (HOSPITAL_COMMUNITY): Payer: Self-pay

## 2018-11-06 DIAGNOSIS — Z362 Encounter for other antenatal screening follow-up: Secondary | ICD-10-CM | POA: Diagnosis not present

## 2018-11-06 DIAGNOSIS — O283 Abnormal ultrasonic finding on antenatal screening of mother: Secondary | ICD-10-CM | POA: Diagnosis not present

## 2018-11-06 DIAGNOSIS — Z3A24 24 weeks gestation of pregnancy: Secondary | ICD-10-CM | POA: Diagnosis not present

## 2018-11-06 DIAGNOSIS — O0990 Supervision of high risk pregnancy, unspecified, unspecified trimester: Secondary | ICD-10-CM

## 2018-11-06 DIAGNOSIS — O09292 Supervision of pregnancy with other poor reproductive or obstetric history, second trimester: Secondary | ICD-10-CM

## 2018-11-09 ENCOUNTER — Other Ambulatory Visit (HOSPITAL_COMMUNITY): Payer: Self-pay | Admitting: *Deleted

## 2018-11-09 DIAGNOSIS — O35EXX0 Maternal care for other (suspected) fetal abnormality and damage, fetal genitourinary anomalies, not applicable or unspecified: Secondary | ICD-10-CM

## 2018-11-09 DIAGNOSIS — O358XX0 Maternal care for other (suspected) fetal abnormality and damage, not applicable or unspecified: Secondary | ICD-10-CM

## 2018-11-15 ENCOUNTER — Inpatient Hospital Stay (HOSPITAL_COMMUNITY)
Admission: AD | Admit: 2018-11-15 | Discharge: 2018-11-15 | Disposition: A | Discharge status: Home / Self Care | Payer: Medicaid Other | Source: Ambulatory Visit | Attending: Obstetrics & Gynecology | Admitting: Obstetrics & Gynecology

## 2018-11-15 ENCOUNTER — Encounter (HOSPITAL_COMMUNITY): Payer: Self-pay | Admitting: *Deleted

## 2018-11-15 DIAGNOSIS — O26892 Other specified pregnancy related conditions, second trimester: Secondary | ICD-10-CM

## 2018-11-15 DIAGNOSIS — Z3A25 25 weeks gestation of pregnancy: Secondary | ICD-10-CM | POA: Insufficient documentation

## 2018-11-15 DIAGNOSIS — R519 Headache, unspecified: Secondary | ICD-10-CM

## 2018-11-15 DIAGNOSIS — R51 Headache: Secondary | ICD-10-CM | POA: Diagnosis not present

## 2018-11-15 LAB — URINALYSIS, MICROSCOPIC (REFLEX)

## 2018-11-15 LAB — URINALYSIS, ROUTINE W REFLEX MICROSCOPIC
Bilirubin Urine: NEGATIVE
Glucose, UA: NEGATIVE mg/dL
Hgb urine dipstick: NEGATIVE
Ketones, ur: NEGATIVE mg/dL
Nitrite: NEGATIVE
PROTEIN: NEGATIVE mg/dL
Specific Gravity, Urine: 1.02 (ref 1.005–1.030)
pH: 6.5 (ref 5.0–8.0)

## 2018-11-15 MED ORDER — GUAIFENESIN ER 600 MG PO TB12: 1200.0000 mg | ORAL_TABLET | Freq: Two times a day (BID) | ORAL | 1 refills | Status: DC

## 2018-11-15 MED ORDER — IBUPROFEN 600 MG PO TABS
600.0000 mg | ORAL_TABLET | Freq: Once | ORAL | Status: AC
  Administered 2018-11-15: 600 mg via ORAL
  Filled 2018-11-15: qty 1

## 2018-11-15 MED ORDER — IBUPROFEN 600 MG PO TABS: 600.0000 mg | ORAL_TABLET | Freq: Four times a day (QID) | ORAL | 0 refills | Status: DC | PRN

## 2018-11-15 NOTE — MAU Provider Note (Signed)
Chief Complaint:  Headache   First Provider Initiated Contact with Patient 11/15/18 1404     HPI: Hannah Pearson is a 23 y.o. G2P1001 at [redacted]w[redacted]d who presents to maternity admissions reporting HA  X 2 days. Dx'd w/ Flu 11/10/18. Feels as though that is getting better, but HA is worse.   Location: frontal, bilat Quality: pressure Severity: 9/10 in pain scale Duration: 2 days Context: Recent Flu Timing: Intermittent  Modifying factors: No improvement w/ Tylenol  Associated signs and symptoms: Neg for fever, chills, neck pain or stillness, N/V. Pos for sinus congestion, rhinorrhea, sensitivity to light.   Denies contractions, leakage of fluid or vaginal bleeding. Good fetal movement.    Past Medical History:  Diagnosis Date  . Gastritis    OB History  Gravida Para Term Preterm AB Living  2 1 1  0 0 1  SAB TAB Ectopic Multiple Live Births  0 0 0        # Outcome Date GA Lbr Len/2nd Weight Sex Delivery Anes PTL Lv  2 Current           1 Term 06/27/12 [redacted]w[redacted]d  6917 g  Vag-Spont  N      Complications: IUGR (intrauterine growth restriction) affecting care of mother   Past Surgical History:  Procedure Laterality Date  . NO PAST SURGERIES     History reviewed. No pertinent family history. Social History   Tobacco Use  . Smoking status: Never Smoker  . Smokeless tobacco: Never Used  Substance Use Topics  . Alcohol use: Not Currently    Frequency: Never  . Drug use: Not Currently   No Known Allergies Medications Prior to Admission  Medication Sig Dispense Refill Last Dose  . Prenatal Vit-Fe Fumarate-FA (PREPLUS) 27-1 MG TABS Take 1 tablet by mouth daily. 30 tablet 13 Taking    I have reviewed patient's Past Medical Hx, Surgical Hx, Family Hx, Social Hx, medications and allergies.   ROS:  Review of Systems  Constitutional: Negative for chills and fever.  HENT: Positive for congestion, rhinorrhea, sinus pressure and sinus pain. Negative for ear pain, sneezing  and sore throat.   Eyes: Positive for photophobia. Negative for visual disturbance.  Respiratory: Negative for cough.   Gastrointestinal: Negative for abdominal pain.  Genitourinary: Negative for vaginal bleeding.  Musculoskeletal: Negative for neck pain and neck stiffness.  Neurological: Positive for headaches. Negative for speech difficulty.    Physical Exam   Patient Vitals for the past 24 hrs:  BP Temp Temp src Pulse Resp SpO2 Height Weight  11/15/18 1309 109/62 98.1 F (36.7 C) Oral 83 20 96 % - -  11/15/18 1306 - - - - - - 5\' 5"  (1.651 m) 84.3 kg   Constitutional: Well-developed, well-nourished female in no acute distress.  HEENT: Mild tenderness of frontal sinuses. + congestion Cardiovascular: normal rate Respiratory: normal effort GI: Abd soft, non-tender, gravid appropriate for gestational age.  Neurologic: Alert and oriented x 4.  GU: Neg CVAT.  Pelvic: NEFG, physiologic discharge, no blood, cervix clean. No CMT     FHT:  Baseline 155, moderate variability, accelerations present, no decelerations Contractions: None   Labs: Results for orders placed or performed during the hospital encounter of 11/15/18 (from the past 24 hour(s))  Urinalysis, Routine w reflex microscopic     Status: Abnormal   Collection Time: 11/15/18  1:16 PM  Result Value Ref Range   Color, Urine YELLOW YELLOW   APPearance CLEAR CLEAR   Specific Gravity, Urine  1.020 1.005 - 1.030   pH 6.5 5.0 - 8.0   Glucose, UA NEGATIVE NEGATIVE mg/dL   Hgb urine dipstick NEGATIVE NEGATIVE   Bilirubin Urine NEGATIVE NEGATIVE   Ketones, ur NEGATIVE NEGATIVE mg/dL   Protein, ur NEGATIVE NEGATIVE mg/dL   Nitrite NEGATIVE NEGATIVE   Leukocytes,Ua MODERATE (A) NEGATIVE  Urinalysis, Microscopic (reflex)     Status: Abnormal   Collection Time: 11/15/18  1:16 PM  Result Value Ref Range   RBC / HPF 0-5 0 - 5 RBC/hpf   WBC, UA 6-10 0 - 5 WBC/hpf   Bacteria, UA FEW (A) NONE SEEN   Squamous Epithelial / LPF 0-5 0  - 5    Imaging:  NA  MAU Course: Orders Placed This Encounter  Procedures  . Urinalysis, Routine w reflex microscopic  . Urinalysis, Microscopic (reflex)  . Droplet Isolation  . Discharge patient   Meds ordered this encounter  Medications  . ibuprofen (ADVIL,MOTRIN) tablet 600 mg  . ibuprofen (ADVIL,MOTRIN) 600 MG tablet    Sig: Take 1 tablet (600 mg total) by mouth every 6 (six) hours as needed for headache or moderate pain. Do not use after [redacted] weeks gestation.    Dispense:  30 tablet    Refill:  0    Order Specific Question:   Supervising Provider    Answer:   Jaynie Collins A [[id number]]  . guaiFENesin (MUCINEX) 600 MG 12 hr tablet    Sig: Take 2 tablets (1,200 mg total) by mouth 2 (two) times daily.    Dispense:  60 tablet    Refill:  1    Order Specific Question:   Supervising Provider    Answer:   Jaynie Collins A [[id number]]   Headache decreased to 2/10 on a pain scale.  Patient feeling much better.  States she is ready to go home.  MDM: -Sinus headache well controlled with ibuprofen.  No evidence of emergent condition.  May use ibuprofen sparingly.  Do not use after [redacted] weeks gestation.  Encouraged patient to push fluids and use Mucinex twice daily.  Headache red flags reviewed.  Assessment: 1. Sinus headache   2. Pregnancy headache in second trimester     Plan: Discharge home in stable condition.  Preterm labor precautions and fetal kick counts Follow-up Information    [facility] Follow up.   Specialty:  Obstetrics and Gynecology Why:  as scheduled or sooner as needed if symptoms worsen Contact information: [address] [place] [address] [phone]       [facility] Follow up.   Specialty:  Obstetrics and Gynecology Why:  As needed in pregnancy emergencies Contact information: [address] [id number] Wilhemina Bonito [place] [address] [phone]          Allergies as of  11/15/2018   No Known Allergies     Medication List    TAKE these medications   guaiFENesin 600 MG 12 hr tablet Commonly known as:  MUCINEX Take 2 tablets (1,200 mg total) by mouth 2 (two) times daily.   ibuprofen 600 MG tablet Commonly known as:  ADVIL,MOTRIN Take 1 tablet (600 mg total) by mouth every 6 (six) hours as needed for headache or moderate pain. Do not use after [redacted] weeks gestation.   PREPLUS 27-1 MG Tabs Take 1 tablet by mouth daily.       Katrinka Blazing, [place], [place] 11/15/2018 3:37 PM

## 2018-11-15 NOTE — MAU Note (Signed)
Hannah Pearson is a 23 y.o. at [redacted]w[redacted]d here in MAU reporting: headache ongoing for 3 days, took extra strength tylenol with no relief. Pt reports she was Dx with the flu on Tuesday.  Onset of complaint: 3 days  Pain score: 8/10  Vitals:   11/15/18 1309  BP: 109/62  Pulse: 83  Resp: 20  Temp: 98.1 F (36.7 C)  SpO2: 96%      FHT:+ FM  Lab orders placed from triage: UA

## 2018-11-15 NOTE — Discharge Instructions (Signed)
Dolor de cabeza sinusal Sinus Headache  El dolor de cabeza sinusal aparece cuando los senos paranasales se taponan o se inflaman. Los senos paranasales son espacios llenos de aire que se encuentran en el crneo detrs de los huesos de la cara y la frente. Los dolores de cabeza sinusales pueden ser leves o intensos. Cules son las causas? El dolor de cabeza sinusal puede deberse a diferentes trastornos que [employer] senos paranasales. Las causas ms frecuentes son las siguientes:  Resfros.  Infecciones de los senos paranasales.  Alergias. Muchas personas confunden los dolores de cabeza sinusales con migraas o dolores de cabeza tensionales ya que esos dolores de cabeza tambin pueden causar dolor facial y sntomas nasales. Cules son los signos o los sntomas? El principal sntoma de esta afeccin es un dolor de cabeza que se puede percibir como dolor u presin en la cara, la frente, los odos o las piezas dentales superiores. Las personas que tienen dolor de cabeza sinusal suelen tener otros sntomas, por ejemplo:  Secrecin o congestin nasal.  Grant Ruts.  Imposibilidad para Psychologist, counselling. Los cambios climticos pueden intensificar los sntomas. Cmo se diagnostica? Esta afeccin se puede diagnosticar en funcin de lo siguiente:  Un examen fsico y antecedentes mdicos.  Estudios de diagnstico por imgenes, como una tomografa computarizada (TC) o una resonancia magntica (RM), para Landscape architect presencia de problemas en los senos paranasales.  El examen de los senos paranasales con un instrumento delgado con una cmara que se inserta a travs de la nariz (endoscopa). Cmo se trata? El tratamiento de esta afeccin depende de la causa.  El dolor sinusal causado por una infeccin de los senos paranasales se puede tratar con antibiticos.  El dolor sinusal causado por alergias se puede aliviar con medicamentos antialrgicos (antihistamnicos) y Environmental consultant.  El dolor sinusal causado por congestin se puede Paramedic al Automotive engineer (lavar) la Clinical cytogeneticist y los senos paranasales con una solucin salina.  En algunos casos, puede ser necesaria la ciruga sinusal si otros tratamientos no son eficaces. Siga estas indicaciones en su casa: Instrucciones generales  Si se lo indican: ? Aplique un pao tibio y [employer] cara para ayudar a Engineer, materials. ? Use solucin salina nasal. Medicamentos   Tome los medicamentos de venta libre y los recetados solamente como se lo haya indicado el mdico.  Si le recetaron un antibitico, tmelo como se lo haya indicado el mdico. No deje de tomar el antibitico aunque comience a sentirse mejor.  Si est congestionado, utilice un aerosol nasal para ayudar a International aid/development worker presin. Hidrtese y humidifique los ambientes  Beba suficiente agua para Pharmacologist la orina clara o de color amarillo plido. Mantenerse hidratado lo ayudar a [employer].  Use un humidificador de vapor fro para mantener la humedad de su hogar por encima del 50%.  Realice inhalaciones de vapor por 10 a , de 3 a 4veces al da o tal como se lo haya indicado el mdico. Puede hacer esto en el bao con el vapor del agua caliente de la ducha.  Limite la exposicin al aire fro o seco. Comunquese con un mdico si:  Tiene dolores de cabeza ms de una vez por semana.  Tiene sensibilidad a la luz o al ruido.  Tiene fiebre.  Siente nuseas o vomita.  Los dolores de cabeza no mejoran con Scientist, research (medical). Muchas personas creen que tienen dolor de cabeza sinusal cuando en realidad tiene una migraa o un dolor de cabeza tensional. Solicite ayuda  de inmediato si:  Tiene problemas de visin.  Siente un dolor intenso repentino en el rostro o la cabeza.  Tiene una convulsin.  Se siente confundido.  Presenta rigidez en el cuello. Resumen  El dolor de cabeza sinusal aparece cuando los senos paranasales se taponan o  se inflaman.  El dolor de cabeza sinusal puede deberse a diferentes trastornos que [employer] senos paranasales, como un resfro, una infeccin sinusal o Vella Raring.  El tratamiento de esta afeccin depende de la causa. Esto puede incluir medicamentos, como antibiticos o antihistamnicos. Esta informacin no tiene Theme park manager el consejo del mdico. Asegrese de hacerle al mdico cualquier pregunta que tenga. Document Released: 09/16/2005 Document Revised: 09/29/2017 Document Reviewed: 09/29/2017 Elsevier Interactive Patient Education  2019 [employer].

## 2018-11-17 ENCOUNTER — Telehealth: Payer: Self-pay | Admitting: Family Medicine

## 2018-11-17 NOTE — Telephone Encounter (Signed)
Attempted to call patient with her new 2 hr and HOB appointments with Spanish interpreter. No answer, LVM with new appointment time and date. Reminder letter mailed out

## 2018-11-19 ENCOUNTER — Other Ambulatory Visit: Payer: Self-pay | Admitting: Advanced Practice Midwife

## 2018-11-26 ENCOUNTER — Other Ambulatory Visit: Payer: Self-pay

## 2018-11-26 ENCOUNTER — Encounter: Payer: Self-pay | Admitting: Obstetrics & Gynecology

## 2018-12-01 ENCOUNTER — Ambulatory Visit (INDEPENDENT_AMBULATORY_CARE_PROVIDER_SITE_OTHER): Payer: Medicaid Other | Admitting: Family Medicine

## 2018-12-01 ENCOUNTER — Other Ambulatory Visit: Payer: Self-pay | Admitting: *Deleted

## 2018-12-01 ENCOUNTER — Telehealth: Payer: Self-pay | Admitting: Family Medicine

## 2018-12-01 ENCOUNTER — Encounter: Payer: Self-pay | Admitting: Family Medicine

## 2018-12-01 ENCOUNTER — Other Ambulatory Visit (HOSPITAL_COMMUNITY)
Admission: RE | Admit: 2018-12-01 | Discharge: 2018-12-01 | Disposition: A | Discharge status: Home / Self Care | Payer: Medicaid Other | Source: Ambulatory Visit | Attending: Obstetrics & Gynecology | Admitting: Obstetrics & Gynecology

## 2018-12-01 ENCOUNTER — Other Ambulatory Visit: Payer: Self-pay

## 2018-12-01 VITALS — BP 107/64 | HR 75 | Wt 186.0 lb

## 2018-12-01 DIAGNOSIS — Z3480 Encounter for supervision of other normal pregnancy, unspecified trimester: Secondary | ICD-10-CM

## 2018-12-01 DIAGNOSIS — Z3483 Encounter for supervision of other normal pregnancy, third trimester: Secondary | ICD-10-CM

## 2018-12-01 DIAGNOSIS — N898 Other specified noninflammatory disorders of vagina: Secondary | ICD-10-CM | POA: Diagnosis not present

## 2018-12-01 DIAGNOSIS — Z3A28 28 weeks gestation of pregnancy: Secondary | ICD-10-CM

## 2018-12-01 DIAGNOSIS — Z23 Encounter for immunization: Secondary | ICD-10-CM

## 2018-12-01 NOTE — Progress Notes (Signed)
   PRENATAL VISIT NOTE  Subjective:  Hannah Pearson is a 23 y.o. G2P1001 at [redacted]w[redacted]d being seen today for ongoing prenatal care.  She is currently monitored for the following issues for this high-risk pregnancy and has Supervision of high risk pregnancy, antepartum and History of prior pregnancy with IUGR newborn on their problem list.  Patient reports malodorous vaginal discharge. No itching or burning.  Contractions: Not present.  .  Movement: Present. Denies leaking of fluid.   The following portions of the patient's history were reviewed and updated as appropriate: allergies, current medications, past family history, past medical history, past social history, past surgical history and problem list. Problem list updated.  Objective:   Vitals:   12/01/18 1751  BP: 107/64  Pulse: 75  Weight: 186 lb (84.4 kg)    Fetal Status:     Movement: Present     General:  Alert, oriented and cooperative. Patient is in no acute distress.  Skin: Skin is warm and dry. No rash noted.   Cardiovascular: Normal heart rate noted  Respiratory: Normal respiratory effort, no problems with respiration noted  Abdomen: Soft, gravid, appropriate for gestational age.  Pain/Pressure: Absent     Pelvic: Cervical exam deferred        Extremities: Normal range of motion.  Edema: None  Mental Status: Normal mood and affect. Normal behavior. Normal judgment and thought content.   Assessment and Plan:  Pregnancy: G2P1001 at 106w1d  1. Supervision of other normal pregnancy, antepartum - doing well  - 3rd trimester labs today - follow up in 2 weeks  2. Vaginal discharge - self-swab performed  - Cervicovaginal ancillary only  Preterm labor symptoms and general obstetric precautions including but not limited to vaginal bleeding, contractions, leaking of fluid and fetal movement were reviewed in detail with the patient. Please refer to After Visit Summary for other counseling recommendations.    Return in about 2 weeks (around 12/15/2018) for HROB.  Future Appointments  Date Time Provider Department Center  12/16/2018 10:55 AM Reva Bores, MD [facility]  01/04/2019  3:00 PM [facility] [place] 3 [facility]    Gwenevere Abbot, MD

## 2018-12-01 NOTE — Progress Notes (Signed)
TDAP today 

## 2018-12-01 NOTE — Telephone Encounter (Signed)
Called the patient to reschedule the appointment as she checked out without being seen.

## 2018-12-02 LAB — CBC
Hematocrit: 35 % (ref 34.0–46.6)
Hemoglobin: 11.4 g/dL (ref 11.1–15.9)
MCH: 29.8 pg (ref 26.6–33.0)
MCHC: 32.6 g/dL (ref 31.5–35.7)
MCV: 91 fL (ref 79–97)
Platelets: 194 10*3/uL (ref 150–450)
RBC: 3.83 x10E6/uL (ref 3.77–5.28)
RDW: 12.4 % (ref 11.7–15.4)
WBC: 9.2 10*3/uL (ref 3.4–10.8)

## 2018-12-02 LAB — GLUCOSE TOLERANCE, 2 HOURS W/ 1HR
Glucose, 1 hour: 91 mg/dL (ref 65–179)
Glucose, 2 hour: 81 mg/dL (ref 65–152)
Glucose, Fasting: 67 mg/dL (ref 65–91)

## 2018-12-02 LAB — HIV ANTIBODY (ROUTINE TESTING W REFLEX): HIV Screen 4th Generation wRfx: NONREACTIVE

## 2018-12-02 LAB — RPR: RPR Ser Ql: NONREACTIVE

## 2018-12-03 ENCOUNTER — Other Ambulatory Visit: Payer: Self-pay | Admitting: Family Medicine

## 2018-12-03 LAB — CERVICOVAGINAL ANCILLARY ONLY
Bacterial vaginitis: POSITIVE — AB
Candida vaginitis: NEGATIVE
Chlamydia: NEGATIVE
Neisseria Gonorrhea: NEGATIVE
Trichomonas: NEGATIVE

## 2018-12-03 MED ORDER — METRONIDAZOLE 500 MG PO TABS: 500.0000 mg | ORAL_TABLET | Freq: Two times a day (BID) | ORAL | 0 refills | Status: DC

## 2018-12-03 NOTE — Progress Notes (Unsigned)
Patient positive for BV. Sent message via mychart and metronidazole to pharmacy.  Gwenevere Abbot, MD 12/03/18 10:38 PM

## 2018-12-15 ENCOUNTER — Telehealth: Payer: Self-pay | Admitting: Student

## 2018-12-15 NOTE — Telephone Encounter (Signed)
Called the patient to inform of the restrictions due to the Coronavirus. No options to leave a voicemail as the voicemail box was full.

## 2018-12-16 ENCOUNTER — Other Ambulatory Visit: Payer: Self-pay

## 2018-12-16 ENCOUNTER — Encounter: Payer: Self-pay | Admitting: Family Medicine

## 2018-12-16 ENCOUNTER — Ambulatory Visit (INDEPENDENT_AMBULATORY_CARE_PROVIDER_SITE_OTHER): Payer: Self-pay | Admitting: Family Medicine

## 2018-12-16 VITALS — BP 106/55 | HR 93 | Wt 186.0 lb

## 2018-12-16 DIAGNOSIS — Z3A30 30 weeks gestation of pregnancy: Secondary | ICD-10-CM

## 2018-12-16 DIAGNOSIS — O0993 Supervision of high risk pregnancy, unspecified, third trimester: Secondary | ICD-10-CM

## 2018-12-16 DIAGNOSIS — O0990 Supervision of high risk pregnancy, unspecified, unspecified trimester: Secondary | ICD-10-CM

## 2018-12-16 DIAGNOSIS — Z8759 Personal history of other complications of pregnancy, childbirth and the puerperium: Secondary | ICD-10-CM

## 2018-12-16 MED ORDER — PREPLUS 27-1 MG PO TABS: 1.0000 | ORAL_TABLET | Freq: Every day | ORAL | 13 refills | Status: AC

## 2018-12-16 NOTE — Patient Instructions (Signed)

## 2018-12-16 NOTE — Progress Notes (Signed)
   PRENATAL VISIT NOTE  Subjective:  Hannah Pearson is a 23 y.o. G2P1001 at [redacted]w[redacted]d being seen today for ongoing prenatal care.  She is currently monitored for the following issues for this high-risk pregnancy and has Supervision of high risk pregnancy, antepartum and History of prior pregnancy with IUGR newborn on their problem list.  Patient reports no complaints.  Contractions: Not present. Vag. Bleeding: None.  Movement: Present. Denies leaking of fluid.   The following portions of the patient's history were reviewed and updated as appropriate: allergies, current medications, past family history, past medical history, past social history, past surgical history and problem list.   Objective:   Vitals:   12/16/18 1111  BP: (!) 106/55  Pulse: 93  Weight: 186 lb (84.4 kg)    Fetal Status: Fetal Heart Rate (bpm): 151 Fundal Height: 30 cm Movement: Present     General:  Alert, oriented and cooperative. Patient is in no acute distress.  Skin: Skin is warm and dry. No rash noted.   Cardiovascular: Normal heart rate noted  Respiratory: Normal respiratory effort, no problems with respiration noted  Abdomen: Soft, gravid, appropriate for gestational age.  Pain/Pressure: Absent     Pelvic: Cervical exam deferred        Extremities: Normal range of motion.  Edema: None  Mental Status: Normal mood and affect. Normal behavior. Normal judgment and thought content.   Assessment and Plan:  Pregnancy: G2P1001 at [redacted]w[redacted]d 1. Supervision of high risk pregnancy, antepartum 28 wk labs reviewed. - Prenatal Vit-Fe Fumarate-FA (PREPLUS) 27-1 MG TABS; Take 1 tablet by mouth daily.  Dispense: 90 tablet; Refill: 13  2. History of prior pregnancy with IUGR newborn Normal growth, last check 71%, has f/u scheduled in early April.  Preterm labor symptoms and general obstetric precautions including but not limited to vaginal bleeding, contractions, leaking of fluid and fetal movement were  reviewed in detail with the patient. Please refer to After Visit Summary for other counseling recommendations.   Return in 2 weeks (on 12/30/2018).  Future Appointments  Date Time Provider Department Center  01/01/2019 10:35 AM Conan Bowens, MD [facility]  01/04/2019  3:00 PM [facility] [place] 3 [facility]    Reva Bores, MD

## 2018-12-31 ENCOUNTER — Telehealth: Payer: Self-pay | Admitting: Family Medicine

## 2018-12-31 NOTE — Telephone Encounter (Signed)
Called the patient with an interupter id [phone]. Informed the patient of the visit being a telephone visit due to the COVID19. The patient stated she is experiencing contractions and lower back pains. Educated the to visit the hospital at [address]. No questions at this time.

## 2019-01-01 ENCOUNTER — Ambulatory Visit (INDEPENDENT_AMBULATORY_CARE_PROVIDER_SITE_OTHER): Payer: Medicaid Other | Admitting: Obstetrics and Gynecology

## 2019-01-01 ENCOUNTER — Encounter: Payer: Self-pay | Admitting: Obstetrics and Gynecology

## 2019-01-01 ENCOUNTER — Other Ambulatory Visit: Payer: Self-pay

## 2019-01-01 DIAGNOSIS — O0993 Supervision of high risk pregnancy, unspecified, third trimester: Secondary | ICD-10-CM | POA: Diagnosis not present

## 2019-01-01 DIAGNOSIS — O09293 Supervision of pregnancy with other poor reproductive or obstetric history, third trimester: Secondary | ICD-10-CM | POA: Diagnosis not present

## 2019-01-01 DIAGNOSIS — O0990 Supervision of high risk pregnancy, unspecified, unspecified trimester: Secondary | ICD-10-CM

## 2019-01-01 DIAGNOSIS — Z3A32 32 weeks gestation of pregnancy: Secondary | ICD-10-CM

## 2019-01-01 DIAGNOSIS — Z8759 Personal history of other complications of pregnancy, childbirth and the puerperium: Secondary | ICD-10-CM

## 2019-01-01 NOTE — Progress Notes (Signed)
   TELEHEALTH VIRTUAL OBSTETRICS VISIT ENCOUNTER NOTE  I connected with Hannah Pearson on 01/01/19 at 10:35 AM EDT by telephone at home and verified that I am speaking with the correct person using two identifiers.   I discussed the limitations, risks, security and privacy concerns of performing an evaluation and management service by telephone and the availability of in person appointments. I also discussed with the patient that there may be a patient responsible charge related to this service. The patient expressed understanding and agreed to proceed.  Subjective:  Hannah Pearson is a 23 y.o. G2P1001 at [redacted]w[redacted]d being followed for ongoing prenatal care.  She is currently monitored for the following issues for this high-risk pregnancy and has Supervision of high risk pregnancy, antepartum and History of prior pregnancy with IUGR newborn on their problem list.  Patient reports occasional contractions. Reports fetal movement. Denies any bleeding or leaking of fluid.   The following portions of the patient's history were reviewed and updated as appropriate: allergies, current medications, past family history, past medical history, past social history, past surgical history and problem list.   Objective:   General:  Alert, oriented and cooperative.   Mental Status: Normal mood and affect perceived. Normal judgment and thought content.  Rest of physical exam deferred due to type of encounter  Assessment and Plan:  Pregnancy: G2P1001 at [redacted]w[redacted]d  1. Supervision of high risk pregnancy, antepartum Patient feeling well, no issues She will come Monday to pick up blood cuff  2. History of prior pregnancy with IUGR newborn Last growth 71st%tile F/u growth 02/01/19   Preterm labor symptoms and general obstetric precautions including but not limited to vaginal bleeding, contractions, leaking of fluid and fetal movement were reviewed in detail with the patient.  I  discussed the assessment and treatment plan with the patient. The patient was provided an opportunity to ask questions and all were answered. The patient agreed with the plan and demonstrated an understanding of the instructions. The patient was advised to call back or seek an in-person office evaluation/go to MAU at [facility] for any urgent or concerning symptoms. Please refer to After Visit Summary for other counseling recommendations.   I provided 12 minutes of non-face-to-face time during this encounter.  Return in about 2 weeks (around 01/15/2019) for OB visit (MD).  Future Appointments  Date Time Provider Department Center  02/01/2019  1:10 PM WH-MFC NURSE WH-MFC MFC-US  02/01/2019  1:15 PM WH-MFC [place] 4 WH-MFCUS MFC-US    Conan Bowens, MD [facility] for [facility], [facility]

## 2019-01-04 ENCOUNTER — Ambulatory Visit (HOSPITAL_COMMUNITY): Payer: Medicaid Other

## 2019-01-12 ENCOUNTER — Telehealth: Payer: Self-pay | Admitting: Obstetrics & Gynecology

## 2019-01-12 NOTE — Telephone Encounter (Signed)
The patient called to schedule the upcoming appointment. Informed of date and time with interrupter id [phone]. Also informed the patient the visit will be via telephone. No questions at this time.

## 2019-01-18 ENCOUNTER — Encounter: Payer: Self-pay | Admitting: Obstetrics and Gynecology

## 2019-01-18 ENCOUNTER — Telehealth: Payer: Self-pay | Admitting: Obstetrics and Gynecology

## 2019-01-18 ENCOUNTER — Ambulatory Visit (INDEPENDENT_AMBULATORY_CARE_PROVIDER_SITE_OTHER): Payer: Medicaid Other | Admitting: Obstetrics and Gynecology

## 2019-01-18 DIAGNOSIS — Z8759 Personal history of other complications of pregnancy, childbirth and the puerperium: Secondary | ICD-10-CM | POA: Diagnosis not present

## 2019-01-18 DIAGNOSIS — O0993 Supervision of high risk pregnancy, unspecified, third trimester: Secondary | ICD-10-CM

## 2019-01-18 DIAGNOSIS — O0990 Supervision of high risk pregnancy, unspecified, unspecified trimester: Secondary | ICD-10-CM

## 2019-01-18 DIAGNOSIS — Z3A35 35 weeks gestation of pregnancy: Secondary | ICD-10-CM | POA: Diagnosis not present

## 2019-01-18 NOTE — Progress Notes (Signed)
   TELEHEALTH VIRTUAL OBSTETRICS VISIT ENCOUNTER NOTE  I connected with Hannah Pearson on 01/18/19 at 10:15 AM EDT by telephone at home and verified that I am speaking with the correct person using two identifiers.   I discussed the limitations, risks, security and privacy concerns of performing an evaluation and management service by telephone and the availability of in person appointments. I also discussed with the patient that there may be a patient responsible charge related to this service. The patient expressed understanding and agreed to proceed.  Subjective:  Hannah Pearson is a 23 y.o. G2P1001 at [redacted]w[redacted]d being followed for ongoing prenatal care.  She is currently monitored for the following issues for this high-risk pregnancy and has Supervision of high risk pregnancy, antepartum and History of prior pregnancy with IUGR newborn on their problem list.  Patient reports occ ut ctx, not painful, no VB or LOF. Reports fetal movement. Denies any contractions, bleeding or leaking of fluid.   The following portions of the patient's history were reviewed and updated as appropriate: allergies, current medications, past family history, past medical history, past social history, past surgical history and problem list.   Objective:   General:  Alert, oriented and cooperative.   Mental Status: Normal mood and affect perceived. Normal judgment and thought content.  Rest of physical exam deferred due to type of encounter  Assessment and Plan:  Pregnancy: G2P1001 at [redacted]w[redacted]d 1. Supervision of high risk pregnancy, antepartum Stable Labor precautions reviewed  2. History of prior pregnancy with IUGR newborn F/U growth on 02/01/19  Preterm labor symptoms and general obstetric precautions including but not limited to vaginal bleeding, contractions, leaking of fluid and fetal movement were reviewed in detail with the patient.  I discussed the assessment and treatment  plan with the patient. The patient was provided an opportunity to ask questions and all were answered. The patient agreed with the plan and demonstrated an understanding of the instructions. The patient was advised to call back or seek an in-person office evaluation/go to MAU at [facility] for any urgent or concerning symptoms. Please refer to After Visit Summary for other counseling recommendations.   I provided 11 minutes of non-face-to-face time during this encounter. Spanish interrupter used during today's visit  Return in about 1 week (around 01/25/2019) for OB visit in person for cultures.  Future Appointments  Date Time Provider Department Center  02/01/2019  1:10 PM WH-MFC NURSE WH-MFC MFC-US  02/01/2019  1:15 PM WH-MFC [place] 4 WH-MFCUS MFC-US    Hermina Staggers, MD Center for [facility], [facility]

## 2019-01-18 NOTE — Telephone Encounter (Signed)
Called patient with Spanish interpreter Mariel to get her scheduled for f/u appointment. Patient was scheduled for 4/27 and verbalized understanding of her appointment. New address was given.

## 2019-01-18 NOTE — Progress Notes (Signed)
Pt states was not enrolled into [facility] & does not have BP Cuff, will get pt enrolled & [facility] will send BP Cuff.Pt has been having a lot of back pain & braxton hicks contractions.

## 2019-01-22 ENCOUNTER — Other Ambulatory Visit: Payer: Self-pay

## 2019-01-22 ENCOUNTER — Encounter (HOSPITAL_COMMUNITY): Payer: Self-pay | Admitting: *Deleted

## 2019-01-22 ENCOUNTER — Inpatient Hospital Stay (HOSPITAL_COMMUNITY)
Admission: AD | Admit: 2019-01-22 | Discharge: 2019-01-22 | Disposition: A | Discharge status: Home / Self Care | Payer: Medicaid Other | Attending: Obstetrics and Gynecology | Admitting: Obstetrics and Gynecology

## 2019-01-22 DIAGNOSIS — O0993 Supervision of high risk pregnancy, unspecified, third trimester: Secondary | ICD-10-CM

## 2019-01-22 DIAGNOSIS — Z3689 Encounter for other specified antenatal screening: Secondary | ICD-10-CM | POA: Diagnosis not present

## 2019-01-22 DIAGNOSIS — O4703 False labor before 37 completed weeks of gestation, third trimester: Secondary | ICD-10-CM | POA: Diagnosis not present

## 2019-01-22 DIAGNOSIS — O0990 Supervision of high risk pregnancy, unspecified, unspecified trimester: Secondary | ICD-10-CM

## 2019-01-22 DIAGNOSIS — Z3A35 35 weeks gestation of pregnancy: Secondary | ICD-10-CM | POA: Insufficient documentation

## 2019-01-22 LAB — URINALYSIS, ROUTINE W REFLEX MICROSCOPIC
Bilirubin Urine: NEGATIVE
Glucose, UA: NEGATIVE mg/dL
Hgb urine dipstick: NEGATIVE
Ketones, ur: 5 mg/dL — AB
Nitrite: NEGATIVE
Protein, ur: NEGATIVE mg/dL
Specific Gravity, Urine: 1.019 (ref 1.005–1.030)
pH: 6 (ref 5.0–8.0)

## 2019-01-22 NOTE — MAU Provider Note (Addendum)
History     CSN: [id number]  Arrival date and time: 01/22/19 1537   First Provider Initiated Contact with Patient 01/22/19 1646      Chief Complaint  Patient presents with  . Abdominal Pain   G2P1001 @35 .4 wks presenting for LAP. Pain started yesterday. Pain is located in lower abdomen, no laterality. Pain is intermittent and sharp, lasting a few seconds. Rates 10/10. Has not taken anything for it. Denies VB and LOF. Also reports ctx q15 min for the last few months. Reports good FM.   OB History    Gravida  2   Para  1   Term  1   Preterm  0   AB  0   Living  1     SAB  0   TAB  0   Ectopic  0   Multiple      Live Births              Past Medical History:  Diagnosis Date  . Gastritis     Past Surgical History:  Procedure Laterality Date  . NO PAST SURGERIES      History reviewed. No pertinent family history.  Social History   Tobacco Use  . Smoking status: Never Smoker  . Smokeless tobacco: Never Used  Substance Use Topics  . Alcohol use: Not Currently    Frequency: Never  . Drug use: Not Currently    Allergies: No Known Allergies  Medications Prior to Admission  Medication Sig Dispense Refill Last Dose  . Prenatal Vit-Fe Fumarate-FA (PREPLUS) 27-1 MG TABS Take 1 tablet by mouth daily. 90 tablet 13 01/21/2019 at 2100  . metroNIDAZOLE (FLAGYL) 500 MG tablet Take 1 tablet (500 mg total) by mouth 2 (two) times daily. (Patient not taking: Reported on 01/01/2019) 14 tablet 0 Not Taking    Review of Systems  Gastrointestinal: Positive for abdominal pain and constipation. Negative for diarrhea, nausea and vomiting.  Genitourinary: Positive for frequency. Negative for dysuria, hematuria, urgency, vaginal bleeding and vaginal discharge.   Physical Exam   Blood pressure 105/65, pulse (!) 103, temperature 98.3 F (36.8 C), temperature source Oral, resp. rate 16, weight 88.3 kg, last menstrual period 05/18/2018, SpO2 99 %.  Physical Exam  Nursing  note and vitals reviewed. Constitutional: She is oriented to person, place, and time. She appears well-developed. No distress.  HENT:  Head: Normocephalic.  Neck: Normal range of motion.  Cardiovascular: Normal rate.  Respiratory: Effort normal. No respiratory distress.  GI: Soft. She exhibits no distension. There is no abdominal tenderness.  gravid  Genitourinary:    Genitourinary Comments: SVE 2/thick, vtx   Musculoskeletal: Normal range of motion.  Neurological: She is alert and oriented to person, place, and time.  Skin: Skin is warm and dry.  Psychiatric: She has a normal mood and affect.  EFM: 145 bpm, mod variability, + accels, no decels Toco: rare  Results for orders placed or performed during the hospital encounter of 01/22/19 (from the past 24 hour(s))  Urinalysis, Routine w reflex microscopic     Status: Abnormal   Collection Time: 01/22/19  4:07 PM  Result Value Ref Range   Color, Urine YELLOW YELLOW   APPearance CLOUDY (A) CLEAR   Specific Gravity, Urine 1.019 1.005 - 1.030   pH 6.0 5.0 - 8.0   Glucose, UA NEGATIVE NEGATIVE mg/dL   Hgb urine dipstick NEGATIVE NEGATIVE   Bilirubin Urine NEGATIVE NEGATIVE   Ketones, ur 5 (A) NEGATIVE mg/dL   Protein,  ur NEGATIVE NEGATIVE mg/dL   Nitrite NEGATIVE NEGATIVE   Leukocytes,Ua MODERATE (A) NEGATIVE   RBC / HPF 0-5 0 - 5 RBC/hpf   WBC, UA 6-10 0 - 5 WBC/hpf   Bacteria, UA FEW (A) NONE SEEN   Squamous Epithelial / LPF 6-10 0 - 5   Mucus PRESENT    MAU Course  Procedures  MDM Labs ordered and reviewed. UA with elevated WBCs but contaminated, will send UC. Pt reports no further pain since arrival. SVE unchanged. Stable for discharge home.   Assessment and Plan   1. [redacted] weeks gestation of pregnancy   2. Supervision of high risk pregnancy, antepartum   3. NST (non-stress test) reactive   4. Preterm uterine contractions, antepartum, third trimester    Discharge home Follow up at [facility] as scheduled PTL  precautions  Allergies as of 01/22/2019   No Known Allergies     Medication List    STOP taking these medications   metroNIDAZOLE 500 MG tablet Commonly known as:  FLAGYL     TAKE these medications   PrePLUS 27-1 MG Tabs Take 1 tablet by mouth daily.      Live interpreter present for encounter  Donette Larry, CNM 01/22/2019, 4:59 PM

## 2019-01-22 NOTE — MAU Note (Signed)
Pt having lower abdominal pains 4 times today. Had some ctx earlier in the week. No bleeding. Says she's not peeing a lot. +FM Rates pain a 10/10.

## 2019-01-22 NOTE — Discharge Instructions (Signed)
Contracciones de Braxton Hicks °Braxton Hicks Contractions °Las contracciones del útero pueden presentarse durante todo el embarazo, pero no siempre indican que la mujer está de parto. Es posible que usted haya tenido contracciones de práctica llamadas "contracciones de Braxton Hicks". A veces, se las confunde con el parto real. °¿Qué son las contracciones de Braxton Hicks? °Las contracciones de Braxton Hicks son espasmos que se producen en los músculos del útero antes del parto. A diferencia de las contracciones del parto verdadero, estas no producen el agrandamiento (la dilatación) ni el afinamiento del cuello uterino. Hacia el final del embarazo (entre las semanas 32 y 34), las contracciones de Braxton Hicks pueden presentarse más seguido y tornarse más intensas. A veces, resulta difícil distinguirlas del parto verdadero porque pueden ser muy molestas. No debe sentirse avergonzada si concurre al hospital con falso parto. °En ocasiones, la única forma de saber si el trabajo de parto es verdadero es que el médico determine si hay cambios en el cuello del útero. El médico le hará un examen físico y quizás le controle las contracciones. Si usted no está de parto verdadero, el examen debe indicar que el cuello uterino no está dilatado y que usted no ha roto bolsa. °Si no hay otros problemas de salud asociados con su embarazo, no habrá inconvenientes si la envían a su casa con un falso parto. Es posible que las contracciones de Braxton Hicks continúen hasta que se desencadene el parto verdadero. °Cómo diferenciar el trabajo de parto falso del verdadero °Trabajo de parto verdadero °· Las contracciones duran de 30 a 70 segundos. °· Las contracciones pueden tornarse muy regulares. °· La molestia generalmente se siente en la parte superior del útero y se extiende hacia la zona baja del abdomen y hacia la cintura. °· Las contracciones no desaparecen cuando usted camina. °· Las contracciones generalmente se hacen más  intensas y aumentan en frecuencia. °· El cuello uterino se dilata y se afina. °Parto falso °· En general, las contracciones son más cortas y no tan intensas como las del parto verdadero. °· En general, las contracciones son irregulares. °· A menudo, las contracciones se sienten en la parte delantera de la parte baja del abdomen y en la ingle. °· Las contracciones pueden desaparecer cuando usted camina o cambia de posición mientras está acostada. °· Las contracciones se vuelven más débiles y su duración es menor a medida que transcurre el tiempo. °· En general, el cuello uterino no se dilata ni se afina. °Siga estas indicaciones en su casa: ° °· Tome los medicamentos de venta libre y los recetados solamente como se lo haya indicado el médico. °· Continúe haciendo los ejercicios habituales y siga las demás indicaciones que el médico le dé. °· Coma y beba con moderación si cree que está de parto. °· Si las contracciones de Braxton Hicks le provocan incomodidad: °? Cambie de posición: si está acostada o descansando, camine; si está caminando, descanse. °? Siéntese y descanse en una bañera con agua tibia. °? Beba suficiente líquido como para mantener la orina de color amarillo pálido. La deshidratación puede provocar contracciones. °? Respire lenta y profundamente varias veces por hora. °· Vaya a todas las visitas de control prenatales y de control como se lo haya indicado el médico. Esto es importante. °Comuníquese con un médico si: °· Tiene fiebre. °· Siente dolor constante en el abdomen. °Solicite ayuda de inmediato si: °· Las contracciones se intensifican, se hacen más regulares y cercanas entre sí. °· Tiene una pérdida de líquido por la vagina. °· Elimina   una mucosidad sanguinolenta (pérdida del tapón mucoso). °· Tiene una hemorragia vaginal. °· Tiene un dolor en la zona lumbar que nunca tuvo antes. °· Siente que la cabeza del bebé empuja hacia abajo y ejerce presión en la zona pélvica. °· El bebé no se mueve tanto  como antes. °Resumen °· Las contracciones que se presentan antes del parto se conocen como contracciones de Braxton Hicks, falso parto o contracciones de práctica. °· En general, las contracciones de Braxton Hicks son más cortas, más débiles, con más tiempo entre una y otra, y menos regulares que las contracciones del parto verdadero. Las contracciones del parto verdadero se intensifican progresivamente y se tornan regulares y más frecuentes. °· Para controlar la molestia que producen las contracciones de Braxton Hicks, puede cambiar de posición, darse un baño templado y descansar, beber mucha agua o practicar la respiración profunda. °Esta información no tiene como fin reemplazar el consejo del médico. Asegúrese de hacerle al médico cualquier pregunta que tenga. °Document Released: 04/28/2017 Document Revised: 09/08/2017 Document Reviewed: 04/28/2017 °Elsevier Interactive Patient Education © 2019 Elsevier Inc. ° °

## 2019-01-24 LAB — CULTURE, OB URINE: Culture: NO GROWTH

## 2019-01-25 ENCOUNTER — Other Ambulatory Visit (HOSPITAL_COMMUNITY)
Admission: RE | Admit: 2019-01-25 | Discharge: 2019-01-25 | Disposition: A | Discharge status: Home / Self Care | Payer: Medicaid Other | Source: Ambulatory Visit | Attending: Obstetrics and Gynecology | Admitting: Obstetrics and Gynecology

## 2019-01-25 ENCOUNTER — Encounter: Payer: Self-pay | Admitting: Obstetrics and Gynecology

## 2019-01-25 ENCOUNTER — Other Ambulatory Visit: Payer: Self-pay

## 2019-01-25 ENCOUNTER — Ambulatory Visit (INDEPENDENT_AMBULATORY_CARE_PROVIDER_SITE_OTHER): Payer: Medicaid Other | Admitting: Obstetrics and Gynecology

## 2019-01-25 VITALS — BP 108/72 | HR 115 | Temp 98.1°F | Wt 193.6 lb

## 2019-01-25 DIAGNOSIS — Z789 Other specified health status: Secondary | ICD-10-CM | POA: Insufficient documentation

## 2019-01-25 DIAGNOSIS — Z3A36 36 weeks gestation of pregnancy: Secondary | ICD-10-CM

## 2019-01-25 DIAGNOSIS — O0990 Supervision of high risk pregnancy, unspecified, unspecified trimester: Secondary | ICD-10-CM | POA: Diagnosis not present

## 2019-01-25 DIAGNOSIS — O0993 Supervision of high risk pregnancy, unspecified, third trimester: Secondary | ICD-10-CM

## 2019-01-25 NOTE — Progress Notes (Signed)
Prenatal Visit Note Date: 01/25/2019 Clinic: Center for [facility]  Subjective:  Hannah Pearson is a 23 y.o. G2P1001 at [redacted]w[redacted]d being seen today for ongoing prenatal care.  She is currently monitored for the following issues for this low risk pregnancy and has Supervision of high risk pregnancy, antepartum; History of prior pregnancy with IUGR newborn; and Language barrier on their problem list.  Patient reports no complaints.   Contractions: Irregular. Vag. Bleeding: None.  Movement: Present. Denies leaking of fluid.   The following portions of the patient's history were reviewed and updated as appropriate: allergies, current medications, past family history, past medical history, past social history, past surgical history and problem list. Problem list updated.  Objective:   Vitals:   01/25/19 1041  BP: 108/72  Pulse: (!) 115  Temp: 98.1 F (36.7 C)  Weight: 193 lb 9.6 oz (87.8 kg)    Fetal Status: Fetal Heart Rate (bpm): 149 Fundal Height: 36 cm Movement: Present  Presentation: Vertex  General:  Alert, oriented and cooperative. Patient is in no acute distress.  Skin: Skin is warm and dry. No rash noted.   Cardiovascular: Normal heart rate noted  Respiratory: Normal respiratory effort, no problems with respiration noted  Abdomen: Soft, gravid, appropriate for gestational age. Pain/Pressure: Present     Pelvic:  Cervical exam deferred Dilation: 2 Effacement (%): Thick Station: Ballotable  Extremities: Normal range of motion.  Edema: None  Mental Status: Normal mood and affect. Normal behavior. Normal judgment and thought content.   Urinalysis:      Assessment and Plan:  Pregnancy: G2P1001 at [redacted]w[redacted]d  1. Supervision of high risk pregnancy, antepartum Unchanged cx check from Saturday. Pt prefers in patient visits Speaks english - GC/Chlamydia probe amp ([place])not at [facility] - Culture, beta strep (group b only)  Preterm labor symptoms and general  obstetric precautions including but not limited to vaginal bleeding, contractions, leaking of fluid and fetal movement were reviewed in detail with the patient. Please refer to After Visit Summary for other counseling recommendations.  Return in about 10 days (around 02/04/2019) for rob. in person.   Tununak Bing, MD  2

## 2019-01-26 LAB — GC/CHLAMYDIA PROBE AMP (~~LOC~~) NOT AT ARMC
Chlamydia: NEGATIVE
Neisseria Gonorrhea: NEGATIVE

## 2019-01-29 ENCOUNTER — Encounter: Payer: Self-pay | Admitting: Obstetrics and Gynecology

## 2019-01-29 DIAGNOSIS — O99820 Streptococcus B carrier state complicating pregnancy: Secondary | ICD-10-CM | POA: Insufficient documentation

## 2019-01-29 HISTORY — DX: Streptococcus B carrier state complicating pregnancy: O99.820

## 2019-01-29 LAB — CULTURE, BETA STREP (GROUP B ONLY): Strep Gp B Culture: POSITIVE — AB

## 2019-02-01 ENCOUNTER — Encounter (HOSPITAL_COMMUNITY): Payer: Self-pay

## 2019-02-01 ENCOUNTER — Other Ambulatory Visit: Payer: Self-pay

## 2019-02-01 ENCOUNTER — Ambulatory Visit (HOSPITAL_COMMUNITY)
Admission: RE | Admit: 2019-02-01 | Discharge: 2019-02-01 | Disposition: A | Discharge status: Home / Self Care | Payer: Medicaid Other | Source: Ambulatory Visit | Attending: Obstetrics and Gynecology | Admitting: Obstetrics and Gynecology

## 2019-02-01 ENCOUNTER — Ambulatory Visit (HOSPITAL_COMMUNITY): Payer: Medicaid Other | Admitting: *Deleted

## 2019-02-01 VITALS — Temp 98.6°F

## 2019-02-01 DIAGNOSIS — O283 Abnormal ultrasonic finding on antenatal screening of mother: Secondary | ICD-10-CM | POA: Diagnosis not present

## 2019-02-01 DIAGNOSIS — O358XX0 Maternal care for other (suspected) fetal abnormality and damage, not applicable or unspecified: Secondary | ICD-10-CM | POA: Diagnosis not present

## 2019-02-01 DIAGNOSIS — O35EXX0 Maternal care for other (suspected) fetal abnormality and damage, fetal genitourinary anomalies, not applicable or unspecified: Secondary | ICD-10-CM

## 2019-02-01 DIAGNOSIS — Z362 Encounter for other antenatal screening follow-up: Secondary | ICD-10-CM

## 2019-02-01 DIAGNOSIS — O09293 Supervision of pregnancy with other poor reproductive or obstetric history, third trimester: Secondary | ICD-10-CM | POA: Diagnosis not present

## 2019-02-01 DIAGNOSIS — Z3A37 37 weeks gestation of pregnancy: Secondary | ICD-10-CM

## 2019-02-01 DIAGNOSIS — O0990 Supervision of high risk pregnancy, unspecified, unspecified trimester: Secondary | ICD-10-CM | POA: Insufficient documentation

## 2019-02-03 ENCOUNTER — Telehealth: Payer: Self-pay | Admitting: Obstetrics & Gynecology

## 2019-02-03 NOTE — Telephone Encounter (Signed)
Called the patient to inform of new location and date and time of appointment. The patient verbalized understanding.  Called with interrupter Veryl Speak

## 2019-02-04 ENCOUNTER — Ambulatory Visit (INDEPENDENT_AMBULATORY_CARE_PROVIDER_SITE_OTHER): Payer: Medicaid Other | Admitting: Obstetrics and Gynecology

## 2019-02-04 ENCOUNTER — Other Ambulatory Visit: Payer: Self-pay

## 2019-02-04 ENCOUNTER — Encounter: Payer: Self-pay | Admitting: Obstetrics and Gynecology

## 2019-02-04 VITALS — BP 112/67 | HR 86 | Temp 98.2°F | Wt 198.8 lb

## 2019-02-04 DIAGNOSIS — O99820 Streptococcus B carrier state complicating pregnancy: Secondary | ICD-10-CM | POA: Diagnosis not present

## 2019-02-04 DIAGNOSIS — Z3A37 37 weeks gestation of pregnancy: Secondary | ICD-10-CM

## 2019-02-04 DIAGNOSIS — O0990 Supervision of high risk pregnancy, unspecified, unspecified trimester: Secondary | ICD-10-CM

## 2019-02-04 NOTE — Progress Notes (Signed)
   PRENATAL VISIT NOTE  Subjective:  Hannah Pearson is a 23 y.o. G2P1001 at [redacted]w[redacted]d being seen today for ongoing prenatal care.  She is currently monitored for the following issues for this low-risk pregnancy and has Supervision of high risk pregnancy, antepartum; History of prior pregnancy with IUGR newborn; Language barrier; and GBS (group B Streptococcus carrier), +RV culture, currently pregnant on their problem list.  Patient reports no complaints.  Contractions: Irritability. Vag. Bleeding: None.  Movement: Present. Denies leaking of fluid.   The following portions of the patient's history were reviewed and updated as appropriate: allergies, current medications, past family history, past medical history, past social history, past surgical history and problem list.   Objective:   Vitals:   02/04/19 0902  BP: 112/67  Pulse: 86  Temp: 98.2 F (36.8 C)  Weight: 198 lb 12.8 oz (90.2 kg)    Fetal Status: Fetal Heart Rate (bpm): 152   Movement: Present     General:  Alert, oriented and cooperative. Patient is in no acute distress.  Skin: Skin is warm and dry. No rash noted.   Cardiovascular: Normal heart rate noted  Respiratory: Normal respiratory effort, no problems with respiration noted  Abdomen: Soft, gravid, appropriate for gestational age.  Pain/Pressure: Absent     Pelvic: Cervical exam deferred        Extremities: Normal range of motion.  Edema: None  Mental Status: Normal mood and affect. Normal behavior. Normal judgment and thought content.   Assessment and Plan:  Pregnancy: G2P1001 at [redacted]w[redacted]d 1. Supervision of high risk pregnancy, antepartum Patient is doing well without complaints Patient with normal growth on last ultrasound and found to be breech presentaion Discussed ECV with the patient who is interested. ECV scheduled for 02/08/19  2. GBS (group B Streptococcus carrier), +RV culture, currently pregnant Prophylaxis in labor  Term labor symptoms and  general obstetric precautions including but not limited to vaginal bleeding, contractions, leaking of fluid and fetal movement were reviewed in detail with the patient. Please refer to After Visit Summary for other counseling recommendations.   Return in about 1 week (around 02/11/2019) for in person: ROB.  Future Appointments  Date Time Provider Department Center  02/11/2019  2:15 PM Josefa Syracuse, Gigi Gin, MD [facility]    Catalina Antigua, MD

## 2019-02-05 ENCOUNTER — Inpatient Hospital Stay (HOSPITAL_COMMUNITY): Payer: Medicaid Other | Admitting: Anesthesiology

## 2019-02-05 ENCOUNTER — Inpatient Hospital Stay (HOSPITAL_COMMUNITY)
Admission: AD | Admit: 2019-02-05 | Discharge: 2019-02-07 | DRG: 788 | Disposition: A | Discharge status: Home / Self Care | Payer: Medicaid Other | Attending: Obstetrics & Gynecology | Admitting: Obstetrics & Gynecology

## 2019-02-05 ENCOUNTER — Encounter (HOSPITAL_COMMUNITY): Payer: Self-pay | Admitting: *Deleted

## 2019-02-05 ENCOUNTER — Other Ambulatory Visit: Payer: Self-pay

## 2019-02-05 ENCOUNTER — Encounter (HOSPITAL_COMMUNITY)
Admission: AD | Disposition: A | Discharge status: Home / Self Care | Payer: Self-pay | Source: Home / Self Care | Attending: Obstetrics & Gynecology

## 2019-02-05 DIAGNOSIS — O329XX0 Maternal care for malpresentation of fetus, unspecified, not applicable or unspecified: Secondary | ICD-10-CM

## 2019-02-05 DIAGNOSIS — O99824 Streptococcus B carrier state complicating childbirth: Secondary | ICD-10-CM | POA: Diagnosis present

## 2019-02-05 DIAGNOSIS — Z8759 Personal history of other complications of pregnancy, childbirth and the puerperium: Secondary | ICD-10-CM

## 2019-02-05 DIAGNOSIS — L7622 Postprocedural hemorrhage and hematoma of skin and subcutaneous tissue following other procedure: Secondary | ICD-10-CM

## 2019-02-05 DIAGNOSIS — O4202 Full-term premature rupture of membranes, onset of labor within 24 hours of rupture: Secondary | ICD-10-CM | POA: Diagnosis not present

## 2019-02-05 DIAGNOSIS — Z3A00 Weeks of gestation of pregnancy not specified: Secondary | ICD-10-CM | POA: Diagnosis not present

## 2019-02-05 DIAGNOSIS — O9989 Other specified diseases and conditions complicating pregnancy, childbirth and the puerperium: Secondary | ICD-10-CM

## 2019-02-05 DIAGNOSIS — O321XX0 Maternal care for breech presentation, not applicable or unspecified: Secondary | ICD-10-CM | POA: Diagnosis present

## 2019-02-05 DIAGNOSIS — O4292 Full-term premature rupture of membranes, unspecified as to length of time between rupture and onset of labor: Secondary | ICD-10-CM | POA: Diagnosis not present

## 2019-02-05 DIAGNOSIS — Z603 Acculturation difficulty: Secondary | ICD-10-CM | POA: Diagnosis present

## 2019-02-05 DIAGNOSIS — O9089 Other complications of the puerperium, not elsewhere classified: Secondary | ICD-10-CM | POA: Diagnosis not present

## 2019-02-05 DIAGNOSIS — O322XX0 Maternal care for transverse and oblique lie, not applicable or unspecified: Secondary | ICD-10-CM

## 2019-02-05 DIAGNOSIS — Z3A37 37 weeks gestation of pregnancy: Secondary | ICD-10-CM | POA: Diagnosis not present

## 2019-02-05 DIAGNOSIS — Z9889 Other specified postprocedural states: Secondary | ICD-10-CM

## 2019-02-05 DIAGNOSIS — Z789 Other specified health status: Secondary | ICD-10-CM | POA: Diagnosis present

## 2019-02-05 DIAGNOSIS — O99820 Streptococcus B carrier state complicating pregnancy: Secondary | ICD-10-CM

## 2019-02-05 HISTORY — PX: WOUND EXPLORATION: SHX6188

## 2019-02-05 LAB — CBC
HCT: 37.2 % (ref 36.0–46.0)
Hemoglobin: 12 g/dL (ref 12.0–15.0)
MCH: 29.9 pg (ref 26.0–34.0)
MCHC: 32.3 g/dL (ref 30.0–36.0)
MCV: 92.8 fL (ref 80.0–100.0)
Platelets: 169 10*3/uL (ref 150–400)
RBC: 4.01 MIL/uL (ref 3.87–5.11)
RDW: 13.2 % (ref 11.5–15.5)
WBC: 11.8 10*3/uL — ABNORMAL HIGH (ref 4.0–10.5)
nRBC: 0 % (ref 0.0–0.2)

## 2019-02-05 LAB — TYPE AND SCREEN
ABO/RH(D): AB POS
Antibody Screen: NEGATIVE

## 2019-02-05 LAB — POCT FERN TEST: POCT Fern Test: POSITIVE

## 2019-02-05 LAB — ABO/RH: ABO/RH(D): AB POS

## 2019-02-05 LAB — RPR: RPR Ser Ql: NONREACTIVE

## 2019-02-05 SURGERY — WOUND EXPLORATION
Anesthesia: General | Wound class: Clean Contaminated

## 2019-02-05 SURGERY — Surgical Case
Anesthesia: Spinal | Wound class: Clean Contaminated

## 2019-02-05 MED ORDER — MENTHOL 3 MG MT LOZG: 1.0000 | LOZENGE | OROMUCOSAL | Status: DC | PRN

## 2019-02-05 MED ORDER — KETOROLAC TROMETHAMINE 30 MG/ML IJ SOLN: 30.0000 mg | Freq: Four times a day (QID) | INTRAMUSCULAR | Status: AC | PRN

## 2019-02-05 MED ORDER — SIMETHICONE 80 MG PO CHEW: 80.0000 mg | CHEWABLE_TABLET | ORAL | Status: DC | PRN

## 2019-02-05 MED ORDER — NALBUPHINE HCL 10 MG/ML IJ SOLN: 5.0000 mg | Freq: Once | INTRAMUSCULAR | Status: DC | PRN

## 2019-02-05 MED ORDER — MIDAZOLAM HCL 2 MG/2ML IJ SOLN
INTRAMUSCULAR | Status: DC | PRN
  Administered 2019-02-05: 2 mg via INTRAVENOUS

## 2019-02-05 MED ORDER — FENTANYL CITRATE (PF) 100 MCG/2ML IJ SOLN
INTRAMUSCULAR | Status: AC
  Filled 2019-02-05: qty 2

## 2019-02-05 MED ORDER — LACTATED RINGERS IV SOLN
INTRAVENOUS | Status: DC | PRN
  Administered 2019-02-05 (×2): via INTRAVENOUS

## 2019-02-05 MED ORDER — CEFAZOLIN SODIUM-DEXTROSE 2-4 GM/100ML-% IV SOLN
2.0000 g | INTRAVENOUS | Status: AC
  Administered 2019-02-05: 09:00:00 2 g via INTRAVENOUS
  Filled 2019-02-05: qty 100

## 2019-02-05 MED ORDER — SENNOSIDES-DOCUSATE SODIUM 8.6-50 MG PO TABS
2.0000 | ORAL_TABLET | ORAL | Status: DC
  Administered 2019-02-06 – 2019-02-07 (×2): 2 via ORAL
  Filled 2019-02-05 (×2): qty 2

## 2019-02-05 MED ORDER — PHENYLEPHRINE HCL-NACL 20-0.9 MG/250ML-% IV SOLN
INTRAVENOUS | Status: DC | PRN
  Administered 2019-02-05: 50 ug/min via INTRAVENOUS

## 2019-02-05 MED ORDER — SCOPOLAMINE 1 MG/3DAYS TD PT72
MEDICATED_PATCH | TRANSDERMAL | Status: AC
  Filled 2019-02-05: qty 1

## 2019-02-05 MED ORDER — KETOROLAC TROMETHAMINE 30 MG/ML IJ SOLN
INTRAMUSCULAR | Status: AC
  Filled 2019-02-05: qty 1

## 2019-02-05 MED ORDER — PROMETHAZINE HCL 25 MG/ML IJ SOLN
12.5000 mg | Freq: Once | INTRAMUSCULAR | Status: AC
  Administered 2019-02-05: 12.5 mg via INTRAVENOUS

## 2019-02-05 MED ORDER — BUPIVACAINE HCL (PF) 0.5 % IJ SOLN
INTRAMUSCULAR | Status: AC
  Filled 2019-02-05: qty 30

## 2019-02-05 MED ORDER — DIPHENHYDRAMINE HCL 25 MG PO CAPS: 25.0000 mg | ORAL_CAPSULE | Freq: Four times a day (QID) | ORAL | Status: DC | PRN

## 2019-02-05 MED ORDER — PHENYLEPHRINE 40 MCG/ML (10ML) SYRINGE FOR IV PUSH (FOR BLOOD PRESSURE SUPPORT)
PREFILLED_SYRINGE | INTRAVENOUS | Status: DC | PRN
  Administered 2019-02-05 (×2): 80 ug via INTRAVENOUS

## 2019-02-05 MED ORDER — TERBUTALINE SULFATE 1 MG/ML IJ SOLN
0.2500 mg | Freq: Once | INTRAMUSCULAR | Status: AC
  Administered 2019-02-05: 0.25 mg via SUBCUTANEOUS
  Filled 2019-02-05: qty 1

## 2019-02-05 MED ORDER — WITCH HAZEL-GLYCERIN EX PADS: 1.0000 "application " | MEDICATED_PAD | CUTANEOUS | Status: DC | PRN

## 2019-02-05 MED ORDER — SUCCINYLCHOLINE CHLORIDE 200 MG/10ML IV SOSY
PREFILLED_SYRINGE | INTRAVENOUS | Status: AC
  Filled 2019-02-05: qty 10

## 2019-02-05 MED ORDER — NALBUPHINE HCL 10 MG/ML IJ SOLN: 5.0000 mg | INTRAMUSCULAR | Status: DC | PRN

## 2019-02-05 MED ORDER — SCOPOLAMINE 1 MG/3DAYS TD PT72
1.0000 | MEDICATED_PATCH | Freq: Once | TRANSDERMAL | Status: DC
  Administered 2019-02-05: 1.5 mg via TRANSDERMAL

## 2019-02-05 MED ORDER — SIMETHICONE 80 MG PO CHEW
80.0000 mg | CHEWABLE_TABLET | ORAL | Status: DC
  Administered 2019-02-06 – 2019-02-07 (×2): 80 mg via ORAL
  Filled 2019-02-05 (×2): qty 1

## 2019-02-05 MED ORDER — PHENYLEPHRINE HCL (PRESSORS) 10 MG/ML IV SOLN
INTRAVENOUS | Status: DC | PRN
  Administered 2019-02-05 (×2): 80 ug via INTRAVENOUS

## 2019-02-05 MED ORDER — OXYCODONE HCL 5 MG PO TABS: 5.0000 mg | ORAL_TABLET | Freq: Once | ORAL | Status: DC | PRN

## 2019-02-05 MED ORDER — DIPHENHYDRAMINE HCL 25 MG PO CAPS: 25.0000 mg | ORAL_CAPSULE | ORAL | Status: DC | PRN

## 2019-02-05 MED ORDER — FAMOTIDINE IN NACL 20-0.9 MG/50ML-% IV SOLN
20.0000 mg | Freq: Once | INTRAVENOUS | Status: AC
  Administered 2019-02-05: 20 mg via INTRAVENOUS
  Filled 2019-02-05: qty 50

## 2019-02-05 MED ORDER — FENTANYL CITRATE (PF) 100 MCG/2ML IJ SOLN
INTRAMUSCULAR | Status: DC | PRN
  Administered 2019-02-05: 50 ug via INTRAVENOUS
  Administered 2019-02-05: 100 ug via INTRAVENOUS

## 2019-02-05 MED ORDER — EPHEDRINE 5 MG/ML INJ
INTRAVENOUS | Status: AC
  Filled 2019-02-05: qty 10

## 2019-02-05 MED ORDER — GLYCOPYRROLATE PF 0.2 MG/ML IJ SOSY
PREFILLED_SYRINGE | INTRAMUSCULAR | Status: AC
  Filled 2019-02-05: qty 1

## 2019-02-05 MED ORDER — FENTANYL CITRATE (PF) 100 MCG/2ML IJ SOLN
INTRAMUSCULAR | Status: DC | PRN
  Administered 2019-02-05: 15 ug via INTRAVENOUS

## 2019-02-05 MED ORDER — SOD CITRATE-CITRIC ACID 500-334 MG/5ML PO SOLN
30.0000 mL | ORAL | Status: AC
  Administered 2019-02-05: 30 mL via ORAL
  Filled 2019-02-05 (×2): qty 30

## 2019-02-05 MED ORDER — ONDANSETRON HCL 4 MG/2ML IJ SOLN: 4.0000 mg | Freq: Three times a day (TID) | INTRAMUSCULAR | Status: DC | PRN

## 2019-02-05 MED ORDER — TETANUS-DIPHTH-ACELL PERTUSSIS 5-2.5-18.5 LF-MCG/0.5 IM SUSP: 0.5000 mL | Freq: Once | INTRAMUSCULAR | Status: DC

## 2019-02-05 MED ORDER — PHENYLEPHRINE 40 MCG/ML (10ML) SYRINGE FOR IV PUSH (FOR BLOOD PRESSURE SUPPORT)
PREFILLED_SYRINGE | INTRAVENOUS | Status: AC
  Filled 2019-02-05: qty 30

## 2019-02-05 MED ORDER — COCONUT OIL OIL
1.0000 "application " | TOPICAL_OIL | Status: DC | PRN
  Administered 2019-02-07: 1 via TOPICAL

## 2019-02-05 MED ORDER — IBUPROFEN 800 MG PO TABS
800.0000 mg | ORAL_TABLET | Freq: Three times a day (TID) | ORAL | Status: DC
  Administered 2019-02-06 – 2019-02-07 (×4): 800 mg via ORAL
  Filled 2019-02-05 (×6): qty 1

## 2019-02-05 MED ORDER — PROPOFOL 10 MG/ML IV BOLUS
INTRAVENOUS | Status: AC
  Filled 2019-02-05: qty 20

## 2019-02-05 MED ORDER — DEXAMETHASONE SODIUM PHOSPHATE 10 MG/ML IJ SOLN
INTRAMUSCULAR | Status: AC
  Filled 2019-02-05: qty 1

## 2019-02-05 MED ORDER — LACTATED RINGERS IV SOLN: INTRAVENOUS | Status: DC

## 2019-02-05 MED ORDER — SIMETHICONE 80 MG PO CHEW
80.0000 mg | CHEWABLE_TABLET | Freq: Three times a day (TID) | ORAL | Status: DC
  Administered 2019-02-06 – 2019-02-07 (×4): 80 mg via ORAL
  Filled 2019-02-05 (×3): qty 1

## 2019-02-05 MED ORDER — SODIUM CHLORIDE 0.9 % IR SOLN
Status: DC | PRN
  Administered 2019-02-05: 1

## 2019-02-05 MED ORDER — OXYCODONE HCL 5 MG/5ML PO SOLN: 5.0000 mg | Freq: Once | ORAL | Status: DC | PRN

## 2019-02-05 MED ORDER — LACTATED RINGERS IV BOLUS
1000.0000 mL | Freq: Once | INTRAVENOUS | Status: AC
  Administered 2019-02-05: 1000 mL via INTRAVENOUS

## 2019-02-05 MED ORDER — BUPIVACAINE HCL (PF) 0.5 % IJ SOLN
INTRAMUSCULAR | Status: DC | PRN
  Administered 2019-02-05: 30 mL

## 2019-02-05 MED ORDER — MORPHINE SULFATE (PF) 0.5 MG/ML IJ SOLN
INTRAMUSCULAR | Status: AC
  Filled 2019-02-05: qty 10

## 2019-02-05 MED ORDER — MORPHINE SULFATE (PF) 0.5 MG/ML IJ SOLN
INTRAMUSCULAR | Status: DC | PRN
  Administered 2019-02-05: .15 mg via EPIDURAL

## 2019-02-05 MED ORDER — PRENATAL MULTIVITAMIN CH
1.0000 | ORAL_TABLET | Freq: Every day | ORAL | Status: DC
  Administered 2019-02-06 – 2019-02-07 (×2): 1 via ORAL
  Filled 2019-02-05 (×3): qty 1

## 2019-02-05 MED ORDER — OXYTOCIN 40 UNITS IN NORMAL SALINE INFUSION - SIMPLE MED: 2.5000 [IU]/h | INTRAVENOUS | Status: AC

## 2019-02-05 MED ORDER — MEPERIDINE HCL 25 MG/ML IJ SOLN: 6.2500 mg | INTRAMUSCULAR | Status: DC | PRN

## 2019-02-05 MED ORDER — NALOXONE HCL 0.4 MG/ML IJ SOLN: 0.4000 mg | INTRAMUSCULAR | Status: DC | PRN

## 2019-02-05 MED ORDER — NALOXONE HCL 4 MG/10ML IJ SOLN
1.0000 ug/kg/h | INTRAVENOUS | Status: DC | PRN
  Filled 2019-02-05: qty 5

## 2019-02-05 MED ORDER — SODIUM CHLORIDE 0.9% FLUSH: 3.0000 mL | INTRAVENOUS | Status: DC | PRN

## 2019-02-05 MED ORDER — PHENYLEPHRINE 40 MCG/ML (10ML) SYRINGE FOR IV PUSH (FOR BLOOD PRESSURE SUPPORT)
PREFILLED_SYRINGE | INTRAVENOUS | Status: AC
  Filled 2019-02-05: qty 10

## 2019-02-05 MED ORDER — PROPOFOL 10 MG/ML IV BOLUS
INTRAVENOUS | Status: DC | PRN
  Administered 2019-02-05: 180 mg via INTRAVENOUS

## 2019-02-05 MED ORDER — SODIUM CHLORIDE 0.9 % IV SOLN
INTRAVENOUS | Status: DC | PRN
  Administered 2019-02-05: 10:00:00 via INTRAVENOUS

## 2019-02-05 MED ORDER — FENTANYL CITRATE (PF) 100 MCG/2ML IJ SOLN: 25.0000 ug | INTRAMUSCULAR | Status: DC | PRN

## 2019-02-05 MED ORDER — METOCLOPRAMIDE HCL 5 MG/ML IJ SOLN
INTRAMUSCULAR | Status: AC
  Filled 2019-02-05: qty 2

## 2019-02-05 MED ORDER — OXYCODONE HCL 5 MG PO TABS
5.0000 mg | ORAL_TABLET | ORAL | Status: DC | PRN
  Administered 2019-02-06 – 2019-02-07 (×2): 5 mg via ORAL
  Administered 2019-02-07 (×2): 10 mg via ORAL
  Filled 2019-02-05 (×2): qty 1
  Filled 2019-02-05 (×2): qty 2

## 2019-02-05 MED ORDER — DEXAMETHASONE SODIUM PHOSPHATE 4 MG/ML IJ SOLN
INTRAMUSCULAR | Status: DC | PRN
  Administered 2019-02-05: 4 mg via INTRAVENOUS

## 2019-02-05 MED ORDER — ONDANSETRON HCL 4 MG/2ML IJ SOLN
INTRAMUSCULAR | Status: DC | PRN
  Administered 2019-02-05: 4 mg via INTRAVENOUS

## 2019-02-05 MED ORDER — LACTATED RINGERS IV SOLN
INTRAVENOUS | Status: DC | PRN
  Administered 2019-02-05 (×3): via INTRAVENOUS

## 2019-02-05 MED ORDER — ONDANSETRON HCL 4 MG/2ML IJ SOLN: 4.0000 mg | Freq: Four times a day (QID) | INTRAMUSCULAR | Status: DC | PRN

## 2019-02-05 MED ORDER — FENTANYL CITRATE (PF) 250 MCG/5ML IJ SOLN
INTRAMUSCULAR | Status: AC
  Filled 2019-02-05: qty 5

## 2019-02-05 MED ORDER — DIBUCAINE (PERIANAL) 1 % EX OINT: 1.0000 "application " | TOPICAL_OINTMENT | CUTANEOUS | Status: DC | PRN

## 2019-02-05 MED ORDER — BUPIVACAINE IN DEXTROSE 0.75-8.25 % IT SOLN
INTRATHECAL | Status: DC | PRN
  Administered 2019-02-05: 2 mL via INTRATHECAL

## 2019-02-05 MED ORDER — ONDANSETRON HCL 4 MG/2ML IJ SOLN
INTRAMUSCULAR | Status: AC
  Filled 2019-02-05: qty 2

## 2019-02-05 MED ORDER — ZOLPIDEM TARTRATE 5 MG PO TABS: 5.0000 mg | ORAL_TABLET | Freq: Every evening | ORAL | Status: DC | PRN

## 2019-02-05 MED ORDER — METOCLOPRAMIDE HCL 5 MG/ML IJ SOLN
INTRAMUSCULAR | Status: DC | PRN
  Administered 2019-02-05: 10 mg via INTRAVENOUS

## 2019-02-05 MED ORDER — SODIUM CHLORIDE 0.9 % IV SOLN
INTRAVENOUS | Status: DC | PRN
  Administered 2019-02-05: 10:00:00 40 [IU] via INTRAVENOUS

## 2019-02-05 MED ORDER — PROMETHAZINE HCL 25 MG/ML IJ SOLN
INTRAMUSCULAR | Status: AC
  Filled 2019-02-05: qty 1

## 2019-02-05 MED ORDER — MIDAZOLAM HCL 2 MG/2ML IJ SOLN
INTRAMUSCULAR | Status: AC
  Filled 2019-02-05: qty 2

## 2019-02-05 MED ORDER — SUCCINYLCHOLINE CHLORIDE 20 MG/ML IJ SOLN
INTRAMUSCULAR | Status: DC | PRN
  Administered 2019-02-05: 120 mg via INTRAVENOUS

## 2019-02-05 MED ORDER — PREPLUS 27-1 MG PO TABS: 1.0000 | ORAL_TABLET | Freq: Every day | ORAL | Status: DC

## 2019-02-05 MED ORDER — DIPHENHYDRAMINE HCL 50 MG/ML IJ SOLN: 12.5000 mg | INTRAMUSCULAR | Status: DC | PRN

## 2019-02-05 SURGICAL SUPPLY — 27 items
BENZOIN TINCTURE PRP APPL 2/3 (GAUZE/BANDAGES/DRESSINGS) ×3 IMPLANT
CLOSURE STERI STRIP 1/2 X4 (GAUZE/BANDAGES/DRESSINGS) ×3 IMPLANT
DERMABOND ADVANCED (GAUZE/BANDAGES/DRESSINGS) ×2
DERMABOND ADVANCED .7 DNX12 (GAUZE/BANDAGES/DRESSINGS) ×1 IMPLANT
DRSG OPSITE POSTOP 4X10 (GAUZE/BANDAGES/DRESSINGS) ×3 IMPLANT
ELECT REM PT RETURN 9FT ADLT (ELECTROSURGICAL) ×3
ELECTRODE REM PT RTRN 9FT ADLT (ELECTROSURGICAL) ×1 IMPLANT
GAUZE SPONGE 4X4 12PLY STRL LF (GAUZE/BANDAGES/DRESSINGS) ×9 IMPLANT
GLOVE BIO SURGEON STRL SZ 6.5 (GLOVE) ×2 IMPLANT
GLOVE BIO SURGEONS STRL SZ 6.5 (GLOVE) ×1
GLOVE BIOGEL PI IND STRL 7.0 (GLOVE) ×1 IMPLANT
GLOVE BIOGEL PI INDICATOR 7.0 (GLOVE) ×2
GOWN STRL REUS W/TWL LRG LVL3 (GOWN DISPOSABLE) ×6 IMPLANT
HEMOSTAT ARISTA ABSORB 3G PWDR (HEMOSTASIS) ×3 IMPLANT
NEEDLE SPNL 18GX3.5 QUINCKE PK (NEEDLE) ×3 IMPLANT
PACK ABDOMINAL MINOR (CUSTOM PROCEDURE TRAY) ×3 IMPLANT
PAD ABD 7.5X8 STRL (GAUZE/BANDAGES/DRESSINGS) ×12 IMPLANT
PENCIL SMOKE EVAC W/HOLSTER (ELECTROSURGICAL) ×3 IMPLANT
SET BERKELEY SUCTION TUBING (SUCTIONS) ×3 IMPLANT
SUT VIC AB 0 CT1 27 (SUTURE) ×4
SUT VIC AB 0 CT1 27XBRD ANBCTR (SUTURE) ×2 IMPLANT
SUT VIC AB 2-0 CTX 36 (SUTURE) ×3 IMPLANT
SUT VIC AB 3-0 CT1 27 (SUTURE) ×2
SUT VIC AB 3-0 CT1 TAPERPNT 27 (SUTURE) ×1 IMPLANT
SUT VIC AB 4-0 KS 27 (SUTURE) ×3 IMPLANT
SYR 30ML LL (SYRINGE) ×3 IMPLANT
TOWEL OR 17X24 6PK STRL BLUE (TOWEL DISPOSABLE) ×3 IMPLANT

## 2019-02-05 SURGICAL SUPPLY — 28 items
BENZOIN TINCTURE PRP APPL 2/3 (GAUZE/BANDAGES/DRESSINGS) ×3 IMPLANT
CLOSURE WOUND 1/4 X3 (GAUZE/BANDAGES/DRESSINGS) ×1
CLOTH BEACON ORANGE TIMEOUT ST (SAFETY) ×3 IMPLANT
DRSG OPSITE POSTOP 4X10 (GAUZE/BANDAGES/DRESSINGS) ×3 IMPLANT
ELECT REM PT RETURN 9FT ADLT (ELECTROSURGICAL) ×3
ELECTRODE REM PT RTRN 9FT ADLT (ELECTROSURGICAL) ×1 IMPLANT
GLOVE BIO SURGEON STRL SZ 6.5 (GLOVE) ×2 IMPLANT
GLOVE BIO SURGEONS STRL SZ 6.5 (GLOVE) ×1
GLOVE BIOGEL PI IND STRL 7.0 (GLOVE) ×1 IMPLANT
GLOVE BIOGEL PI INDICATOR 7.0 (GLOVE) ×2
GOWN STRL REUS W/TWL LRG LVL3 (GOWN DISPOSABLE) ×9 IMPLANT
NEEDLE SPNL 18GX3.5 QUINCKE PK (NEEDLE) ×3 IMPLANT
NS IRRIG 1000ML POUR BTL (IV SOLUTION) ×3 IMPLANT
PACK C SECTION WH (CUSTOM PROCEDURE TRAY) ×3 IMPLANT
PAD OB MATERNITY 4.3X12.25 (PERSONAL CARE ITEMS) ×3 IMPLANT
PENCIL SMOKE EVAC W/HOLSTER (ELECTROSURGICAL) ×3 IMPLANT
STRIP CLOSURE SKIN 1/4X3 (GAUZE/BANDAGES/DRESSINGS) ×2 IMPLANT
SUT VIC AB 0 CT1 36 (SUTURE) ×3 IMPLANT
SUT VIC AB 2-0 CT1 27 (SUTURE) ×2
SUT VIC AB 2-0 CT1 TAPERPNT 27 (SUTURE) ×1 IMPLANT
SUT VIC AB 2-0 CTX 36 (SUTURE) ×12 IMPLANT
SUT VIC AB 3-0 CT1 27 (SUTURE) ×2
SUT VIC AB 3-0 CT1 TAPERPNT 27 (SUTURE) ×1 IMPLANT
SUT VIC AB 4-0 KS 27 (SUTURE) ×3 IMPLANT
SYR 30ML LL (SYRINGE) ×3 IMPLANT
TOWEL OR 17X24 6PK STRL BLUE (TOWEL DISPOSABLE) ×3 IMPLANT
TRAY FOLEY W/BAG SLVR 14FR LF (SET/KITS/TRAYS/PACK) ×3 IMPLANT
WATER STERILE IRR 1000ML POUR (IV SOLUTION) ×3 IMPLANT

## 2019-02-05 NOTE — Lactation Note (Signed)
This note was copied from a baby's chart. Lactation Consultation Note  Patient Name: [name] [id number] Date: 02/05/2019 Reason for consult: Initial assessment;Early term 37-38.6wks P2, 1 hour female infant, ETI and C/S delivery.  LC called in house interpreter,per mom, she is bi- lingual and doesn't want an interpreter the  [facility]   interpreter confirmed this. Per mom, she only stated  Spanish  in admission  due to her husband  speaking limited English not herself.   Per mom, she breastfeed her 51 year old son for three years exclusively. Mom is active on the [facility] program in [place].  Mom has DEBP at home. Infant was reluctant to latch at this time. LC taught hand expression and Mom taught back giving infant  8 ml of colostrum by spoon. LC discussed mom to do  as  much STS  as possible. LC discussed identifying  hunger cues, mom will BF according to  hunger cues, 8 or more times  within 24 hours. LC discussed I&O. Reviewed booklet " Baby & Me book's Breastfeeding Basics" .  Discussed [place] [facility] and [facility]. Mom knows to call Nurse or LC if she has any questions, concerns or need assistance with latching infant to breast.  Mom made aware of O/P services, breastfeeding support groups, community resources, and our phone # for post-discharge questions.  Maternal Data Formula Feeding for Exclusion: Yes Reason for exclusion: Mother's choice to formula and breast feed on admission Has patient been taught Hand Expression?: Yes(Infant was given 8 ml of colostrum by spoon.) Does the patient have breastfeeding experience prior to this delivery?: Yes  Feeding Feeding Type: Breast Fed  LATCH Score                   Interventions Interventions: Breast feeding basics reviewed;Skin to skin;Hand express;Expressed milk  Lactation Tools Discussed/Used WIC Program: Yes   Consult Status Consult Status:  Follow-up Date: 02/06/19 Follow-up type: In-patient    Danelle Earthly 02/05/2019, 8:58 PM

## 2019-02-05 NOTE — Anesthesia Procedure Notes (Signed)
Procedure Name: Intubation Performed by: Flossie Dibble, CRNA Pre-anesthesia Checklist: Patient identified, Patient being monitored, Timeout performed, Emergency Drugs available and Suction available Patient Re-evaluated:Patient Re-evaluated prior to induction Oxygen Delivery Method: Circle System Utilized Preoxygenation: Pre-oxygenation with 100% oxygen Induction Type: IV induction, Cricoid Pressure applied and Rapid sequence Laryngoscope Size: Mac and 3 Grade View: Grade I Tube type: Oral Tube size: 7.0 mm Number of attempts: 1 Airway Equipment and Method: stylet Placement Confirmation: ETT inserted through vocal cords under direct vision,  positive ETCO2 and breath sounds checked- equal and bilateral Secured at: 21 cm Tube secured with: Tape Dental Injury: Teeth and Oropharynx as per pre-operative assessment

## 2019-02-05 NOTE — MAU Note (Signed)
Called Marrion Coy on [place], unable to reach, states she is on another call

## 2019-02-05 NOTE — MAU Note (Signed)
Anesthesiologist called and given update on pt

## 2019-02-05 NOTE — Transfer of Care (Signed)
Immediate Anesthesia Transfer of Care Note  Patient: Hannah Pearson  Procedure(s) Performed: WOUND EXPLORATION (N/A )  Patient Location: PACU  Anesthesia Type:General  Level of Consciousness: awake, alert  and oriented  Airway & Oxygen Therapy: Patient Spontanous Breathing and Patient connected to nasal cannula oxygen  Post-op Assessment: Report given to RN and Post -op Vital signs reviewed and stable  Post vital signs: Reviewed and stable  Last Vitals:  Vitals Value Taken Time  BP 106/52 02/05/2019  1:52 PM  Temp    Pulse 90 02/05/2019  1:53 PM  Resp 13 02/05/2019  1:53 PM  SpO2 100 % 02/05/2019  1:53 PM  Vitals shown include unvalidated device data.  Last Pain:  Vitals:   02/05/19 1145  TempSrc:   PainSc: Asleep         Complications: No apparent anesthesia complications

## 2019-02-05 NOTE — Anesthesia Preprocedure Evaluation (Signed)
Anesthesia Evaluation  Patient identified by MRN, date of birth, ID band Patient awake    Reviewed: Allergy & Precautions, H&P , NPO status , Patient's Chart, lab work & pertinent test results  Airway Mallampati: II   Neck ROM: full    Dental   Pulmonary neg pulmonary ROS,    breath sounds clear to auscultation       Cardiovascular negative cardio ROS   Rhythm:regular Rate:Normal     Neuro/Psych    GI/Hepatic   Endo/Other  obese  Renal/GU      Musculoskeletal   Abdominal   Peds  Hematology   Anesthesia Other Findings   Reproductive/Obstetrics (+) Pregnancy Ruptured membranes and transverse fetal position                             Anesthesia Physical Anesthesia Plan  ASA: I  Anesthesia Plan: Spinal   Post-op Pain Management:    Induction: Intravenous  PONV Risk Score and Plan: 2 and Ondansetron and Treatment may vary due to age or medical condition  Airway Management Planned: Simple Face Mask  Additional Equipment:   Intra-op Plan:   Post-operative Plan:   Informed Consent: I have reviewed the patients History and Physical, chart, labs and discussed the procedure including the risks, benefits and alternatives for the proposed anesthesia with the patient or authorized representative who has indicated his/her understanding and acceptance.       Plan Discussed with: CRNA, Anesthesiologist and Surgeon  Anesthesia Plan Comments:         Anesthesia Quick Evaluation

## 2019-02-05 NOTE — Anesthesia Preprocedure Evaluation (Signed)
Anesthesia Evaluation  Patient identified by MRN, date of birth, ID band Patient awake    Reviewed: Allergy & Precautions, H&P , NPO status , Patient's Chart, lab work & pertinent test results  Airway Mallampati: II   Neck ROM: full    Dental   Pulmonary neg pulmonary ROS,    breath sounds clear to auscultation       Cardiovascular negative cardio ROS   Rhythm:regular Rate:Normal     Neuro/Psych    GI/Hepatic   Endo/Other    Renal/GU      Musculoskeletal   Abdominal   Peds  Hematology   Anesthesia Other Findings   Reproductive/Obstetrics S/p C/S earlier today                             Anesthesia Physical Anesthesia Plan  ASA: II  Anesthesia Plan: MAC   Post-op Pain Management:    Induction: Intravenous  PONV Risk Score and Plan: 2 and Propofol infusion, Ondansetron and Treatment may vary due to age or medical condition  Airway Management Planned: Simple Face Mask  Additional Equipment:   Intra-op Plan:   Post-operative Plan:   Informed Consent: I have reviewed the patients History and Physical, chart, labs and discussed the procedure including the risks, benefits and alternatives for the proposed anesthesia with the patient or authorized representative who has indicated his/her understanding and acceptance.       Plan Discussed with: CRNA, Anesthesiologist and Surgeon  Anesthesia Plan Comments:         Anesthesia Quick Evaluation

## 2019-02-05 NOTE — Discharge Summary (Signed)
Postpartum Discharge Summary     Patient Name: Hannah Pearson DOB: [birth year] MRN: [id number]  Date of admission: 02/05/2019 Delivering Provider: Allie BossierVE, MYRA C   Date of discharge: 02/07/2019  Admitting diagnosis: ROM, Breech, early labor Intrauterine pregnancy: 5417w4d       Discharge diagnosis: Term Pregnancy Delivered                               Postpartum procedures:none Complications: None  Hospital course:  Induction of Labor With Cesarean Section  23 y.o. yo [id number] at 4117w4d was admitted to the hospital 02/05/2019 for Cesarean section. Patient had a labor course significant for ROM, early labor, breech. The patient went for cesarean section due to Malpresentation, and delivered a Viable infant,02/05/2019  Membrane Rupture Time/Date: 6:00 AM ,02/05/2019 . Details of operation can be found in separate operative note.  Patient had an uncomplicated postpartum course. She is ambulating, tolerating a regular diet, passing flatus, and urinating well.  Patient is discharged home in stable condition on 02/07/19.                                   Physical exam  Vitals:   02/06/19 0940 02/06/19 1530 02/06/19 2211 02/07/19 0527  BP: 105/64 102/60 113/63 103/63  Pulse: 84 76 84 64  Resp: 20 18  18   Temp: 98.6 F (37 C) 98.3 F (36.8 C) 97.9 F (36.6 C) 98.4 F (36.9 C)  TempSrc:  Oral Oral Oral  SpO2: 100%  100%   Weight:      Height:       General: alert, well-appearing, NAD Lochia: appropriate Uterine Fundus: firm Incision: steristrips in place, C/D/I, no honeycomb in place DVT Evaluation: No significant calf/ankle edema.  Labs: Lab Results  Component Value Date   WBC 16.3 (H) 02/06/2019   HGB 9.9 (L) 02/06/2019   HCT 30.3 (L) 02/06/2019   MCV 91.3 02/06/2019   PLT 148 (L) 02/06/2019   CMP Latest Ref Rng & Units 07/05/2018  Glucose 70 - 99 mg/dL 89  BUN 6 - 20 mg/dL 5(L)  Creatinine 1.470.44 - 1.00 mg/dL 8.290.56  Sodium 562135 - 130145 mmol/L 135  Potassium 3.5 - 5.1  mmol/L 4.1  Chloride 98 - 111 mmol/L 105  CO2 22 - 32 mmol/L 23  Calcium 8.9 - 10.3 mg/dL 9.3  Total Protein 6.5 - 8.1 g/dL 6.6  Total Bilirubin 0.3 - 1.2 mg/dL 0.4  Alkaline Phos 38 - 126 U/L 52  AST 15 - 41 U/L 17  ALT 0 - 44 U/L 14    Discharge instruction: per After Visit Summary and "Baby and Me Booklet".  After visit meds:  Allergies as of 02/07/2019   No Known Allergies     Medication List    TAKE these medications   ibuprofen 800 MG tablet Commonly known as:  ADVIL Take 1 tablet (800 mg total) by mouth 3 (three) times daily.   oxyCODONE 5 MG immediate release tablet Commonly known as:  Oxy IR/ROXICODONE Take 1 tablet (5 mg total) by mouth every 6 (six) hours as needed for up to 5 days for severe pain.   PrePLUS 27-1 MG Tabs Take 1 tablet by mouth daily.   senna-docusate 8.6-50 MG tablet Commonly known as:  Senokot-S Take 2 tablets by mouth at bedtime as needed for mild constipation.  Diet: routine diet Activity: Advance as tolerated. Pelvic rest for 6 weeks.   Outpatient follow up:2 weeks incision check and Nexplanon, 4 weeks pp visit Follow up Visit: Follow-up Information    [facility]. Schedule an appointment as soon as possible for a visit.   Specialty:  Obstetrics and Gynecology Why:  You should receive a call to schedule an incision check and a postpartum visit. If you do not, please call clinic to make this appointment in 1-2 and 4-6 weeks.  Contact information: [address], [address] [id number] mc [place] [address] [phone]        Please schedule this patient for Postpartum visit in: 2 weeks with the following provider: Any provider For C/S patients schedule nurse incision check in weeks 2 weeks: yes  Delivery mode:  CS Anticipated Birth Control:  Nexplanon PP Procedures needed: none Schedule Integrated BH visit: no  Newborn Data: Live born female  Birth  Weight: 2975g  APGAR:8 ,9   Newborn Delivery   Birth date/time:  02/05/2019 09:47:00 Delivery type:  C-Section, Low Transverse Trial of labor:  No C-section categorization:  Primary    Baby Feeding: Breast Disposition:home with mother  02/07/2019 Tamera Stands, DO

## 2019-02-05 NOTE — Op Note (Signed)
02/05/2019  10:18 AM  PATIENT:  Hannah Pearson  23 y.o. female  PRE-OPERATIVE DIAGNOSIS:  Breech, ruptured membranes, early labor  POST-OPERATIVE DIAGNOSIS:  same  PROCEDURE:  Procedure(s): CESAREAN SECTION  SURGEON:  Surgeon(s) and Role:    * Hannah Petrenko, Leanora Ivanoff, MD - Primary    * Earlene Plater, Laurel S, DO - Fellow   ANESTHESIA:   local and spinal  EBL:  421 mL   BLOOD ADMINISTERED: 500 cc  DRAINS: Urinary Catheter (Foley)   LOCAL MEDICATIONS USED:  MARCAINE     SPECIMEN:  Source of Specimen:  cord blood  DISPOSITION OF SPECIMEN:  PATHOLOGY  COUNTS:  YES  TOURNIQUET:  * No tourniquets in log *  DICTATION: .Dragon Dictation  PLAN OF CARE: Admit to inpatient   PATIENT DISPOSITION:  PACU - hemodynamically stable.   Delay start of Pharmacological VTE agent (>24hrs) due to surgical blood loss or risk of bleeding: not applicable  The risks, benefits, and alternatives of surgery were explained, understood, accepted. Consents were signed. All questions were answered. In the operating room spinal anesthesia was applied without complication. Her abdomen and vagina were prepped and draped in the usual sterile fashion. A Foley catheter was placed, draining clear urine throughout case. Timeout procedure was done. After adequate anesthesia was assured 30 mL for 0.5% Marcaine was injected into the subcutaneous tissue at the site of her previous cesarean. An incision was made approximately 2cm above the symphysis pubis.  The incision was carried down through the subcutaneous tissue to the fascia. The fascia was scored the midline and extended bilaterally. The middle 20% of the rectus muscles were separated in a transverse fashion using electrosurgical technique. Excellent hemostasis was maintained. The peritoneum was entered with hemostats. Peritoneal incision was extended bilaterally with the Bovie. The bladder blade was placed. A transverse incision was made on the well-developed  lower uterine segment. The uterine incision was extended with traction on each side. Amniotomy was performed with a hemostat. Clear fluid was noted. The baby was delivered from a frank breech presentation. The mouth and nostrils were suctioned. Cord clamping was delayed for a minute per routine. The baby's cord was clamped and cut and was transferred to the NICU personnel for routine care. The placenta was delivered intact with traction. The uterus was left in situ and the interior was cleaned with a dry lap sponge. The uterine incision was closed with 2-0 Vicryl running locking suture. The uterine serosa was then closed with 2-0 vicryl suture in a running fashion. Excellent hemostasis was noted. By tilting the uterus each side was able to visualize the adnexa, and they were normal. The rectus fascia rectus muscles were noted be hemostatic as well. The fascia was closed with a 0 vicryl suture in a running nonlocking fashion. No defects were palpable. The subcutaneous tissue was irrigated, clean, and dried. A subcuticular closure was done with a 3-0 Vicryl suture. Steri-Strips are placed. Excellent cosmetic results were obtained. She was taken to the recovery room in stable condition. She tolerated the procedure well.

## 2019-02-05 NOTE — Progress Notes (Signed)
Patient ID: Hannah Pearson, female   DOB: 03-20-1996, 23 y.o.   MRN: [id number]  I was called to evaluate some bleeding from the incision along with a lower than normal (for her) BP. Her pulse remained normal. A pressure dressing was placed, but she bled through the dressing. I will take her back to the OR to investigate. Consent obtained. All questions answered.

## 2019-02-05 NOTE — H&P (Signed)
OBSTETRIC ADMISSION HISTORY AND PHYSICAL  Hannah Pearson is a 23 y.o. female G2P1001 with IUP at [redacted]w[redacted]d by L/13 presenting for PROM. Fetal malpresentation so needs primary C/S.    Reports fetal movement. Denies vaginal bleeding. Reports some contractions.   She received her prenatal care at [facility].  Support person in labor: father of baby  Ultrasounds . Anatomy U/S: right pyelectasis (4.5 mm), posterior placenta, otherwise normal anatomy scan . 24w4: EFW 71%, mild right UTD still present . 37w0: breech, normal interval growth   Prenatal History/Complications: . GBS(+) . Malpresentation . History of IUGR  Past Medical History: Past Medical History:  Diagnosis Date  . Gastritis     Past Surgical History: Past Surgical History:  Procedure Laterality Date  . NO PAST SURGERIES      Obstetrical History: OB History    Gravida  2   Para  1   Term  1   Preterm  0   AB  0   Living  1     SAB  0   TAB  0   Ectopic  0   Multiple      Live Births              Social History: Social History   Socioeconomic History  . Marital status: Married    Spouse name: Not on file  . Number of children: 1  . Years of education: Not on file  . Highest education level: Not on file  Occupational History  . Not on file  Social Needs  . Financial resource strain: Not on file  . Food insecurity:    Worry: Not on file    Inability: Not on file  . Transportation needs:    Medical: Not on file    Non-medical: Not on file  Tobacco Use  . Smoking status: Never Smoker  . Smokeless tobacco: Never Used  Substance and Sexual Activity  . Alcohol use: Not Currently    Frequency: Never  . Drug use: Not Currently  . Sexual activity: Yes    Birth control/protection: None  Lifestyle  . Physical activity:    Days per week: Not on file    Minutes per session: Not on file  . Stress: Not on file  Relationships  . Social connections:    Talks on phone:  Not on file    Gets together: Not on file    Attends religious service: Not on file    Active member of club or organization: Not on file    Attends meetings of clubs or organizations: Not on file    Relationship status: Not on file  Other Topics Concern  . Not on file  Social History Narrative   ** Merged History Encounter **        Family History: History reviewed. No pertinent family history.  Allergies: No Known Allergies  Medications Prior to Admission  Medication Sig Dispense Refill Last Dose  . Prenatal Vit-Fe Fumarate-FA (PREPLUS) 27-1 MG TABS Take 1 tablet by mouth daily. 90 tablet 13 02/05/2019 at Unknown time     Review of Systems  All systems reviewed and negative except as stated in HPI  Blood pressure 125/75, pulse 96, temperature 98.3 F (36.8 C), resp. rate 16, height 5\' 6"  (1.676 m), weight 91 kg, last menstrual period 05/18/2018, SpO2 100 %. General appearance: alert, well-appearing, NAD Lungs: no respiratory distress Heart: regular rate  Abdomen: soft, non-tender; gravid  Pelvic: deferred Extremities: no significant LE edema  Presentation: breech Fetal monitoring: 130s by doppler Uterine activity: irregular Dilation: 3 Effacement (%): 50 Exam by:: ginger morris rn  Prenatal labs: ABO, Rh: --/--/AB POS (05/08 0745) Antibody: NEG (05/08 0745) Rubella: Immune (11/14 0000) RPR: Non Reactive (03/03 0905)  HBsAg: Negative (11/14 0000)  HIV: Non Reactive (03/03 0905)  GBS:   positive Glucola: normal 2-hr GTT Genetic screening:  Not done  Prenatal Transfer Tool  Maternal Diabetes: No Genetic Screening: Declined Maternal Ultrasounds/Referrals: Normal - aside from UTD right Fetal Ultrasounds or other Referrals:  None Maternal Substance Abuse:  No Significant Maternal Medications:  None Significant Maternal Lab Results: None  Results for orders placed or performed during the hospital encounter of 02/05/19 (from the past 24 hour(s))  POCT fern test    Collection Time: 02/05/19  7:38 AM  Result Value Ref Range   POCT Fern Test Positive = ruptured amniotic membanes   Type and screen [facility]   Collection Time: 02/05/19  7:45 AM  Result Value Ref Range   ABO/RH(D) AB POS    Antibody Screen NEG    Sample Expiration      02/08/2019,2359 Performed at [facility], [address] [place], [place] [address]   CBC   Collection Time: 02/05/19  7:50 AM  Result Value Ref Range   WBC 11.8 (H) 4.0 - 10.5 K/uL   RBC 4.01 3.87 - 5.11 MIL/uL   Hemoglobin 12.0 12.0 - 15.0 g/dL   HCT 19.137.2 47.836.0 - 29.546.0 %   MCV 92.8 80.0 - 100.0 fL   MCH 29.9 26.0 - 34.0 pg   MCHC 32.3 30.0 - 36.0 g/dL   RDW 62.113.2 30.811.5 - 65.715.5 %   Platelets 169 150 - 400 K/uL   nRBC 0.0 0.0 - 0.2 %    Patient Active Problem List   Diagnosis Date Noted  . Transverse or oblique fetal presentation 02/05/2019  . GBS (group B Streptococcus carrier), +RV culture, currently pregnant 01/29/2019  . Language barrier 01/25/2019  . Supervision of high risk pregnancy, antepartum 08/26/2018  . History of prior pregnancy with IUGR newborn 08/26/2018    Assessment/Plan:  Hannah Pearson is a 23 y.o. G2P1001 at 7319w4d here for PROM and malpresentation who now needs a primary Cesarean section.  The risks of cesarean section discussed with the patient included but were not limited to: bleeding which may require transfusion or reoperation; infection which may require antibiotics; injury to bowel, bladder, ureters or other surrounding organs; injury to the fetus; need for additional procedures including hysterectomy in the event of a life-threatening hemorrhage; placental abnormalities wth subsequent pregnancies, incisional problems, thromboembolic phenomenon and other postoperative/anesthesia complications. The patient concurred with the proposed plan, giving informed written consent for the procedure.   Patient has been NPO since last night, and she will  remain NPO for procedure. Anesthesia and OR aware. Preoperative prophylactic antibiotics and SCDs ordered on call to the OR.  To OR when ready.  Fetal Wellbeing: EFW 7-8lbs by Leopold's.  -- GBS (positive) -- continuous fetal monitoring - categ   Postpartum Planning -- breast/nexplanon -- RI/[x] Tdap   Reshard Guillet S. Earlene PlaterWallace, DO OB/GYN Fellow

## 2019-02-05 NOTE — Transfer of Care (Signed)
Immediate Anesthesia Transfer of Care Note  Patient: Hannah Pearson  Procedure(s) Performed: CESAREAN SECTION  Patient Location: PACU  Anesthesia Type:Spinal  Level of Consciousness: awake and alert   Airway & Oxygen Therapy: Patient Spontanous Breathing  Post-op Assessment: Report given to RN and Post -op Vital signs reviewed and stable  Post vital signs: Reviewed and stable  Last Vitals:  Vitals Value Taken Time  BP 116/60 02/05/2019 10:45 AM  Temp    Pulse 104 02/05/2019 10:45 AM  Resp 16 02/05/2019 10:45 AM  SpO2 98 % 02/05/2019 10:45 AM  Vitals shown include unvalidated device data.  Last Pain:  Vitals:   02/05/19 0726  PainSc: 10-Worst pain ever         Complications: No apparent anesthesia complications

## 2019-02-05 NOTE — Anesthesia Procedure Notes (Signed)
Spinal  Patient location during procedure: OR Start time: 02/05/2019 9:18 AM End time: 02/05/2019 9:20 AM Staffing Anesthesiologist: Achille Rich, MD Performed: anesthesiologist  Preanesthetic Checklist Completed: patient identified, surgical consent, pre-op evaluation, timeout performed, IV checked, risks and benefits discussed and monitors and equipment checked Spinal Block Patient position: sitting Prep: DuraPrep Patient monitoring: cardiac monitor, continuous pulse ox and blood pressure Approach: midline Location: L3-4 Injection technique: single-shot Needle Needle type: Pencan  Needle gauge: 24 G Needle length: 9 cm Assessment Sensory level: T10 Additional Notes Functioning IV was confirmed and monitors were applied. Sterile prep and drape, including hand hygiene and sterile gloves were used. The patient was positioned and the spine was prepped. The skin was anesthetized with lidocaine.  Free flow of clear CSF was obtained prior to injecting local anesthetic into the CSF.  The spinal needle aspirated freely following injection.  The needle was carefully withdrawn.  The patient tolerated the procedure well.

## 2019-02-05 NOTE — Op Note (Signed)
02/05/2019  1:14 PM  PATIENT:  Hannah Pearson  23 y.o. female  PRE-OPERATIVE DIAGNOSIS:  Bleeding from incision  POST-OPERATIVE DIAGNOSIS:  Small bleeding from uterine incision  PROCEDURE:  Procedure(s): WOUND EXPLORATION (N/A)  SURGEON:  Surgeon(s) and Role:    * Marice Potter, Leanora Ivanoff, MD - Primary   ASSISTANTS: Rhett Bannister, DO   ANESTHESIA:   local and general  EBL:  11 cc   BLOOD ADMINISTERED:none  DRAINS: Urinary Catheter (Foley)   LOCAL MEDICATIONS USED:  MARCAINE     SPECIMEN:  No Specimen  DISPOSITION OF SPECIMEN:  N/A  COUNTS:  YES  TOURNIQUET:  * No tourniquets in log *  DICTATION: .Dragon Dictation  PLAN OF CARE: Admit to inpatient   PATIENT DISPOSITION:  PACU - hemodynamically stable.   Delay start of Pharmacological VTE agent (>24hrs) due to surgical blood loss or risk of bleeding: not applicable The risks, benefits, and alternatives were explained, understood and accepted. Consent was signed. In the OR she was prepped and draped in the usual sterile fashion. A time out procedure was done. Her incision was infiltrated with 30 cc of 0.5% marcaine. The skin incision was opened. The subcutaenous tissue was dry. There was some bleeding coming out of the fascia so that also was opened. All that we could see including the rectus muscles was hemostatic. She was then given general anesthesia. The peritoneal closure was removed and a moderate amount of blood was noted. The gutters were observed and no blood was noted. The only site of bleeding was just above the uterine suture line. I placed a figure of eight suture and applied Arista. Hemostasis was noted. The subcutaneous tissue was closed with interrupted 3-0 vicryl suture. The skin was closed with 4-0 vicryl. She was extubated and taken to the recovery room in stable condition.

## 2019-02-05 NOTE — MAU Note (Signed)
Pt presents with complaints of LOF since around 6am. Contractions started shortly after

## 2019-02-06 LAB — CBC
HCT: 30.3 % — ABNORMAL LOW (ref 36.0–46.0)
Hemoglobin: 9.9 g/dL — ABNORMAL LOW (ref 12.0–15.0)
MCH: 29.8 pg (ref 26.0–34.0)
MCHC: 32.7 g/dL (ref 30.0–36.0)
MCV: 91.3 fL (ref 80.0–100.0)
Platelets: 148 10*3/uL — ABNORMAL LOW (ref 150–400)
RBC: 3.32 MIL/uL — ABNORMAL LOW (ref 3.87–5.11)
RDW: 13.2 % (ref 11.5–15.5)
WBC: 16.3 10*3/uL — ABNORMAL HIGH (ref 4.0–10.5)
nRBC: 0 % (ref 0.0–0.2)

## 2019-02-06 NOTE — Progress Notes (Signed)
Subjective: Postpartum Day #1: Cesarean Delivery Patient reports tolerating PO; foley just removed- has not voided yet; pt denies dizziness with standing; breastfeeding going slowly; plans Nexplanon for PP contraception  Objective: Vital signs in last 24 hours: Temp:  [97.4 F (36.3 C)-98.8 F (37.1 C)] 98.4 F (36.9 C) (05/09 0629) Pulse Rate:  [66-107] 66 (05/09 0629) Resp:  [10-27] 18 (05/09 0629) BP: (83-116)/(44-76) 105/63 (05/09 0629) SpO2:  [91 %-100 %] 98 % (05/09 3202)  Physical Exam:  General: alert, cooperative and no distress Lochia: appropriate Uterine Fundus: firm Incision: honeycomb intact, sm staining DVT Evaluation: No evidence of DVT seen on physical exam.  Recent Labs    02/05/19 0750 02/06/19 0523  HGB 12.0 9.9*  HCT 37.2 30.3*    Assessment/Plan: Status post Cesarean section. Doing well postoperatively.  Continue current care. Anticipate d/c on POD#2. Encouraged pt to ambulate today.  Arabella Merles CNM 02/06/2019, 7:42 AM

## 2019-02-07 MED ORDER — OXYCODONE HCL 5 MG PO TABS: 5.0000 mg | ORAL_TABLET | Freq: Four times a day (QID) | ORAL | 0 refills | Status: AC | PRN

## 2019-02-07 MED ORDER — IBUPROFEN 800 MG PO TABS: 800.0000 mg | ORAL_TABLET | Freq: Three times a day (TID) | ORAL | 0 refills | Status: AC

## 2019-02-07 MED ORDER — SENNOSIDES-DOCUSATE SODIUM 8.6-50 MG PO TABS: 2.0000 | ORAL_TABLET | Freq: Every evening | ORAL | Status: DC | PRN

## 2019-02-07 NOTE — Lactation Note (Signed)
This note was copied from a baby's chart. Lactation Consultation Note:  Mother reports that she doesn't have milk yet. She did pump thick yellow gel like stuff. Mother explained that this colostrum and that she will see the color change every day. Infant is 50 hours. Mother has been breast and bottle feeding formula.  Mother reports that she can hand express drops of colosturm. Mother has a hand pump and a DEBP.  Encouraged mother to be consistent with post pumping to protect her milk supply. Discussed early term infants behavior.   Mother ask if she could take anything to make her milk come faster and to make more milk. Mother is from the [place]. She reports that she has hear of [place]. Again discussed good breastfeeding hand expression and post pumping.   Advised frequent STS and continue to supplement infant with ebm/ formula.  Discussed changes in stool color as milk is coming to full volume.   infant was jusft fed a bottle  . Mother has her lying in a cradle hold and infant is cuing.  Discussed cue base feeding and feed infant 8-12 times in 24 hours.   Mother  Is aware that she can follow up with [facility] services as needed.   Patient Name: Hannah Pearson [id number] Date: 02/07/2019 Reason for consult: Follow-up assessment   Maternal Data    Feeding Feeding Type: Formula  LATCH Score                   Interventions Interventions: Hand pump  Lactation Tools Discussed/Used     Consult Status Consult Status: Complete    Michel Bickers 02/07/2019, 1:20 PM

## 2019-02-08 ENCOUNTER — Inpatient Hospital Stay (HOSPITAL_COMMUNITY): Admission: RE | Admit: 2019-02-08 | Payer: Medicaid Other | Source: Ambulatory Visit

## 2019-02-08 ENCOUNTER — Telehealth: Payer: Self-pay | Admitting: Obstetrics & Gynecology

## 2019-02-08 ENCOUNTER — Encounter (HOSPITAL_COMMUNITY): Payer: Self-pay | Admitting: Obstetrics & Gynecology

## 2019-02-08 NOTE — Anesthesia Postprocedure Evaluation (Signed)
Anesthesia Post Note  Patient: Hannah Pearson  Procedure(s) Performed: WOUND EXPLORATION (N/A )     Patient location during evaluation: PACU Anesthesia Type: General Level of consciousness: awake and alert Pain management: pain level controlled Vital Signs Assessment: post-procedure vital signs reviewed and stable Respiratory status: spontaneous breathing, nonlabored ventilation, respiratory function stable and patient connected to nasal cannula oxygen Cardiovascular status: blood pressure returned to baseline and stable Postop Assessment: no apparent nausea or vomiting Anesthetic complications: no    Last Vitals:  Vitals:   02/06/19 2211 02/07/19 0527  BP: 113/63 103/63  Pulse: 84 64  Resp:  18  Temp: 36.6 C 36.9 C  SpO2: 100%     Last Pain:  Vitals:   02/07/19 1130  TempSrc:   PainSc: 4                  Prestina Raigoza S

## 2019-02-08 NOTE — Anesthesia Postprocedure Evaluation (Signed)
Anesthesia Post Note  Patient: Hannah Pearson  Procedure(s) Performed: CESAREAN SECTION     Patient location during evaluation: PACU Anesthesia Type: Spinal Level of consciousness: oriented and awake and alert Pain management: pain level controlled Vital Signs Assessment: post-procedure vital signs reviewed and stable Respiratory status: spontaneous breathing, respiratory function stable and patient connected to nasal cannula oxygen Cardiovascular status: blood pressure returned to baseline and stable Postop Assessment: no headache, no backache and no apparent nausea or vomiting Anesthetic complications: no    Last Vitals:  Vitals:   02/06/19 2211 02/07/19 0527  BP: 113/63 103/63  Pulse: 84 64  Resp:  18  Temp: 36.6 C 36.9 C  SpO2: 100%     Last Pain:  Vitals:   02/07/19 1130  TempSrc:   PainSc: 4                  Caressa Scearce S

## 2019-02-08 NOTE — Telephone Encounter (Signed)
Called the patient to confirm the appointment, the patient verbalized understating. Also sending an appointment reminder.

## 2019-02-11 ENCOUNTER — Encounter: Payer: Medicaid Other | Admitting: Obstetrics and Gynecology

## 2019-02-18 ENCOUNTER — Telehealth: Payer: Self-pay | Admitting: Obstetrics and Gynecology

## 2019-02-18 NOTE — Telephone Encounter (Signed)
Called the patient to confirm the upcoming appointment with interrupter ID [id number]. The patient picked up and hung up. Second attempt received the message the person you are trying to call has a voicemail box that has not been setup yet. Good-bye.

## 2019-02-23 ENCOUNTER — Other Ambulatory Visit: Payer: Self-pay

## 2019-02-23 ENCOUNTER — Ambulatory Visit (INDEPENDENT_AMBULATORY_CARE_PROVIDER_SITE_OTHER): Payer: Medicaid Other | Admitting: Lactation Services

## 2019-02-23 DIAGNOSIS — Z5189 Encounter for other specified aftercare: Secondary | ICD-10-CM

## 2019-02-23 NOTE — Progress Notes (Signed)
Pt here today incision check s/p c-section on 02/05/19. With Spanish Interpreter Eda R., Pt denies any pain or bleeding.  Pt reports itching at the site.  I explained to the pt that it is a sign of healing to continue to monitor.  Incision observed with scab like areas across the incision. And there was less than a half inch of suture still intact. No drainage, odor, or erythema.   Informed pt to continue to monitor the incision and the provider will evaluate the site at her pp visit on 03/08/19 face to face with Nexplanon insertion.  Pt agreed with no further questions.   Addison Naegeli, RN 02/23/19

## 2019-03-04 ENCOUNTER — Telehealth: Payer: Self-pay | Admitting: Obstetrics and Gynecology

## 2019-03-04 NOTE — Progress Notes (Signed)
I have reviewed this chart and agree with the RN/CMA assessment and management.    Lakera Viall C Oshay Stranahan, MD, FACOG Attending Physician, [facility]  

## 2019-03-04 NOTE — Telephone Encounter (Signed)
Attempted to call patient with Interpreter [phone]. Wasn't able to leave a message on her VM because it wasn't set up.

## 2019-03-08 ENCOUNTER — Encounter: Payer: Self-pay | Admitting: Obstetrics and Gynecology

## 2019-03-08 ENCOUNTER — Ambulatory Visit (INDEPENDENT_AMBULATORY_CARE_PROVIDER_SITE_OTHER): Payer: Medicaid Other | Admitting: Obstetrics and Gynecology

## 2019-03-08 ENCOUNTER — Other Ambulatory Visit: Payer: Self-pay

## 2019-03-08 VITALS — BP 116/66 | HR 64 | Temp 98.7°F | Ht 65.0 in | Wt 173.1 lb

## 2019-03-08 DIAGNOSIS — Z1389 Encounter for screening for other disorder: Secondary | ICD-10-CM | POA: Diagnosis not present

## 2019-03-08 DIAGNOSIS — Z3202 Encounter for pregnancy test, result negative: Secondary | ICD-10-CM

## 2019-03-08 DIAGNOSIS — Z30017 Encounter for initial prescription of implantable subdermal contraceptive: Secondary | ICD-10-CM

## 2019-03-08 LAB — POCT PREGNANCY, URINE: Preg Test, Ur: NEGATIVE

## 2019-03-08 MED ORDER — ETONOGESTREL 68 MG ~~LOC~~ IMPL
68.0000 mg | DRUG_IMPLANT | Freq: Once | SUBCUTANEOUS | Status: AC
  Administered 2019-03-08: 68 mg via SUBCUTANEOUS

## 2019-03-08 NOTE — Procedures (Signed)
Nexplanon Insertion Procedure Note Prior to the procedure being performed, the patient (or guardian) was asked to state their full name, date of birth, type of procedure being performed and the exact location of the operative site. This information was then checked against the documentation in the patient's chart. Prior to the procedure being performed, a "time out" was performed by the physician that confirmed the correct patient, procedure and site. UPT negative and no sex since delivery.   After informed consent was obtained, the patient's non-dominant left arm was chosen for insertion. A site was marked approximately 8 cm proximal to the medial epicondyle in the sulcus between the biceps and triceps on the inner surface. The area was cleaned with alcohol then local anesthesia was infiltrated with 3 ml of 1% lidocaine along the planned insertion track. The area was prepped with betadine. Using sterile technique the Nexplanon device was inserted per manufacturer's guidelines in the subdermal connective tissue using the standard insertion technique without difficulty. Pressure was applied and the insertion site was hemostatic. The presence of the Nexplanon was confirmed immediately after insertion by palpation by both me and the patient and by checking the tip of needle for the absence of the insert.  A pressure dressing was applied.   The patient tolerated the procedure well. Patient told to consider device effective in one week  Aletha Halim, Brooke Bonito MD Attending Center for [facility])

## 2019-03-08 NOTE — Progress Notes (Signed)
Obstetrics and Gynecology Postpartum Visit  Appointment Date: 03/08/2019  OBGYN Clinic: [facility]  Primary Care Provider: Patient, No Pcp Per  Chief Complaint:  Chief Complaint  Patient presents with  . Postpartum Care    History of Present Illness: Fiona Kendrick Remigio is a 23 y.o. Hispanic [phone] (Patient's last menstrual period was 05/18/2018.), seen for the above chief complaint. Her past medical history is significant for nothing   She is s/p pLTCS for breech and then wound re-opened due to some bleeding at the hysterotomy on the same day on 5/8; she was discharged to home on POD#2  Vaginal bleeding or discharge: occasional spotting Breast or formula feeding: breast Intercourse: No  Contraception after delivery: abstinence PP depression s/s: No  Any bowel or bladder issues: No  Pap smear: no abnormalities (date: 2019)  Review of Systems: as noted in the History of Present Illness.  Medications Shaleka A. Meran-Padilla had no medications administered during this visit. Current Outpatient Medications  Medication Sig Dispense Refill  . Prenatal Vit-Fe Fumarate-FA (PREPLUS) 27-1 MG TABS Take 1 tablet by mouth daily. 90 tablet 13  . ibuprofen (ADVIL) 800 MG tablet Take 1 tablet (800 mg total) by mouth 3 (three) times daily. (Patient not taking: Reported on 03/08/2019) 30 tablet 0   No current facility-administered medications for this visit.     Allergies Patient has no known allergies.  Physical Exam:  BP 116/66   Pulse 64   Temp 98.7 F (37.1 C) (Oral)   Ht 5\' 5"  (1.651 m)   Wt 173 lb 1.6 oz (78.5 kg)   LMP 05/18/2018   BMI 28.81 kg/m  Body mass index is 28.81 kg/m. General appearance: Well nourished, well developed female in no acute distress.  Respiratory: . Normal respiratory effort Abdomen: soft, nttp, nd. C/d/i incision with two white vicryl sutures coming from incision. It was cut and then two tails removed.   Neuro/Psych:  Normal mood and affect.  Skin:  Warm and dry.   Laboratory: UPT neg  PP Depression Screening:  EPDS score 0  Assessment: pt doing well  Plan: routine care nexplanon placed Pt told to repeat pap in two years  RTC PRN  Durene Romans MD Attending Center for [employer] [facility])

## 2019-03-09 ENCOUNTER — Encounter: Payer: Self-pay | Admitting: *Deleted

## 2019-04-20 ENCOUNTER — Encounter (HOSPITAL_COMMUNITY): Payer: Self-pay | Admitting: Obstetrics & Gynecology

## 2019-04-30 IMAGING — US US MFM OB FOLLOW-UP
1 series · 13 of 28 positions shown · non-contrast
Comparison: none

[Series 1: us mfm ob follow-up · 13 of 38 slices shown]
[im 2/38]
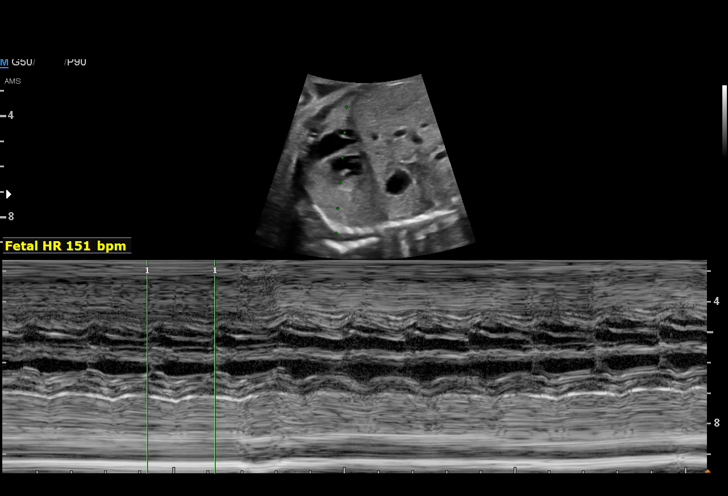
[im 5/38]
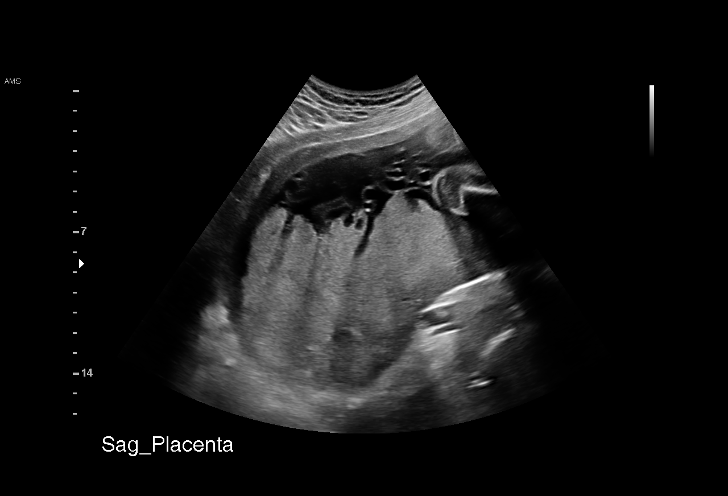
[im 7/38]
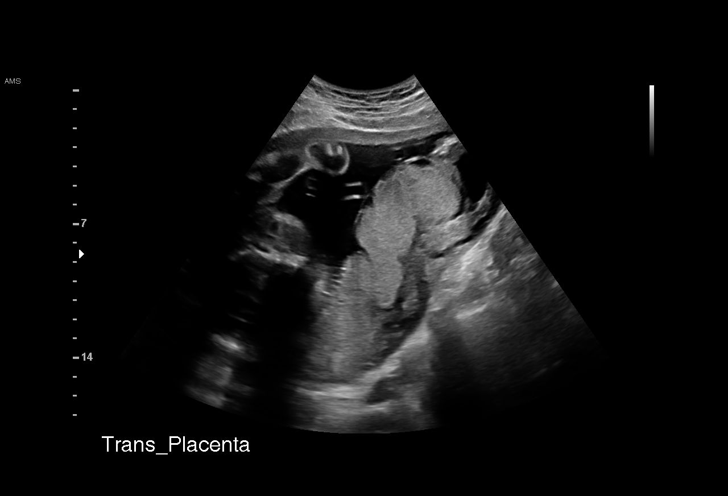
[im 10/38]
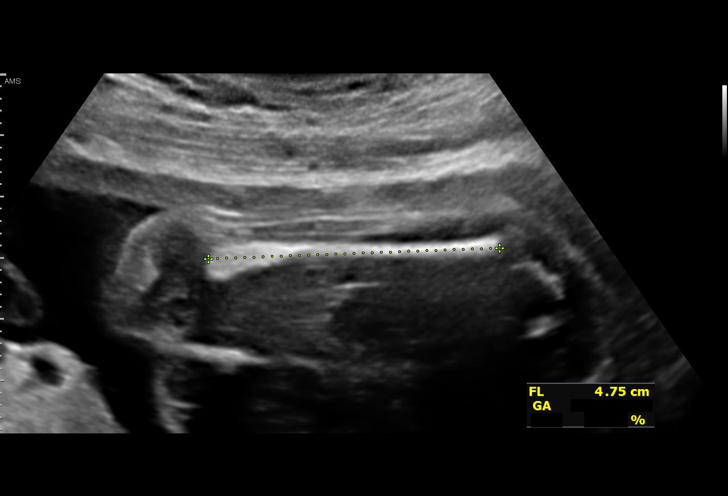
[im 13/38]
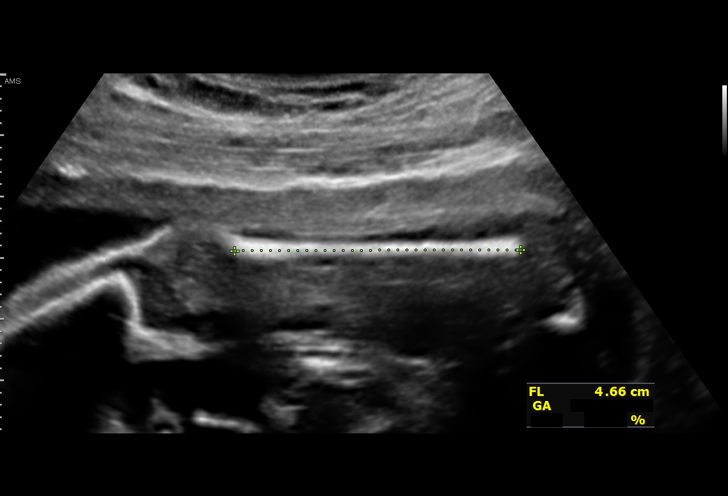
[im 16/38]
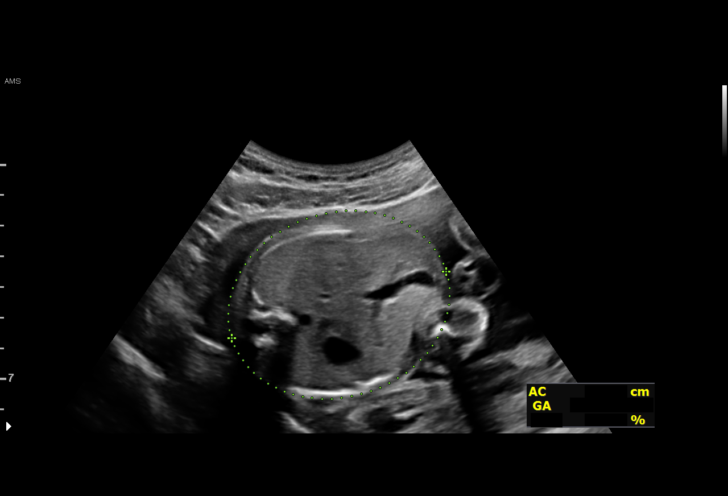
[im 20/38]
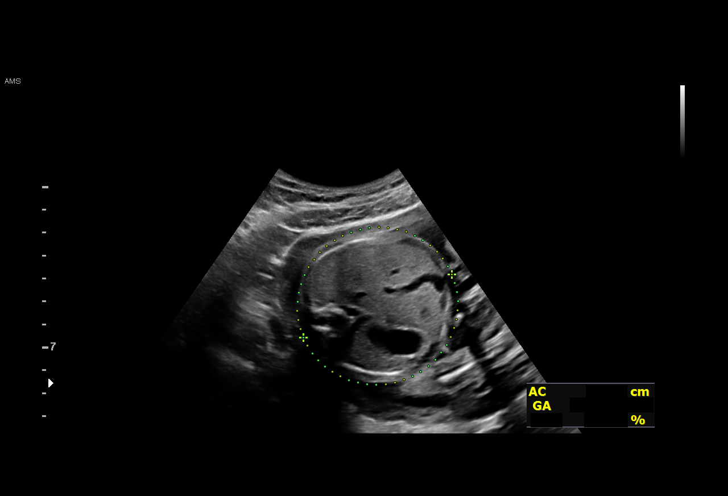
[im 22/38]
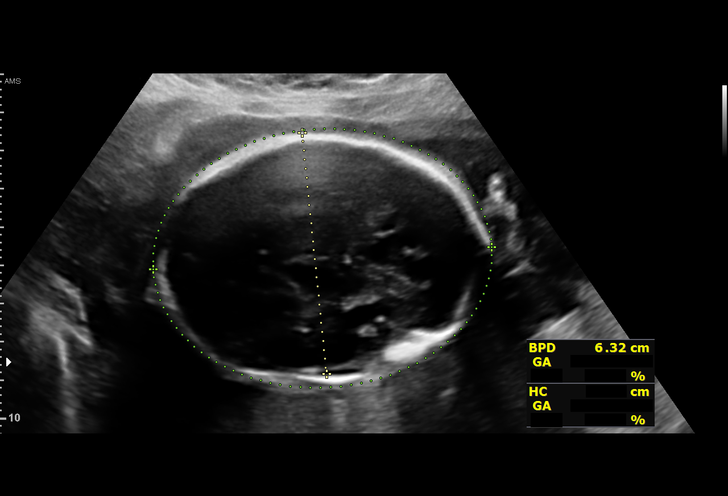
[im 25/38]
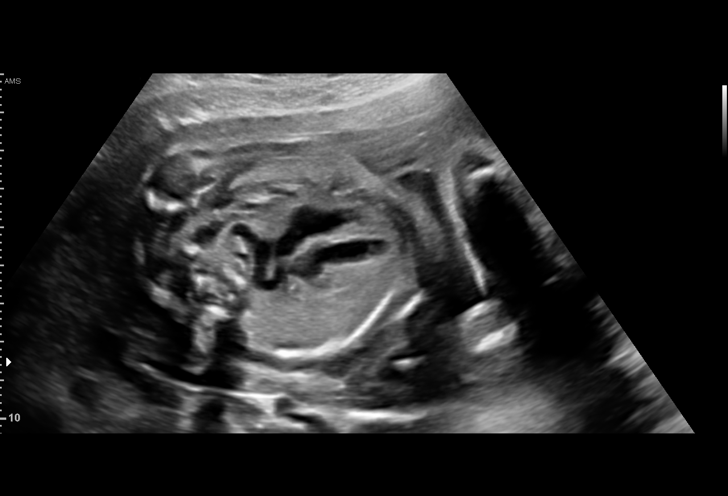
[im 28/38]
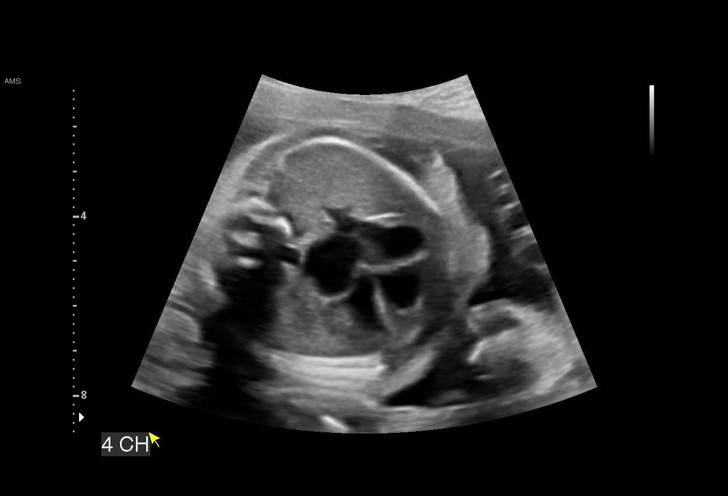
[im 31/38]
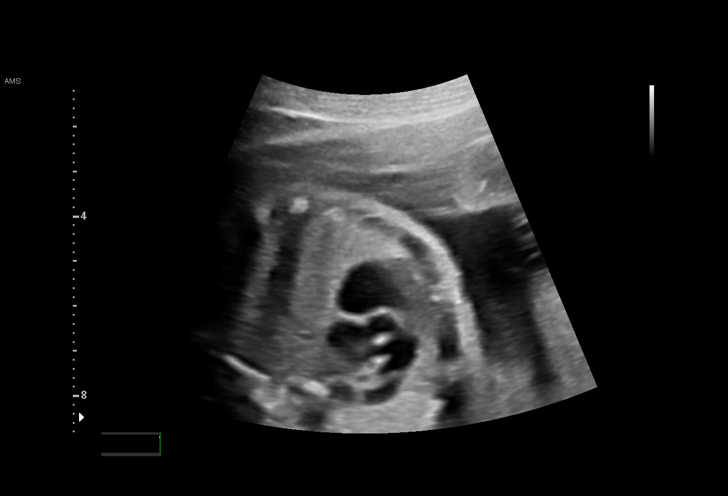
[im 33/38]
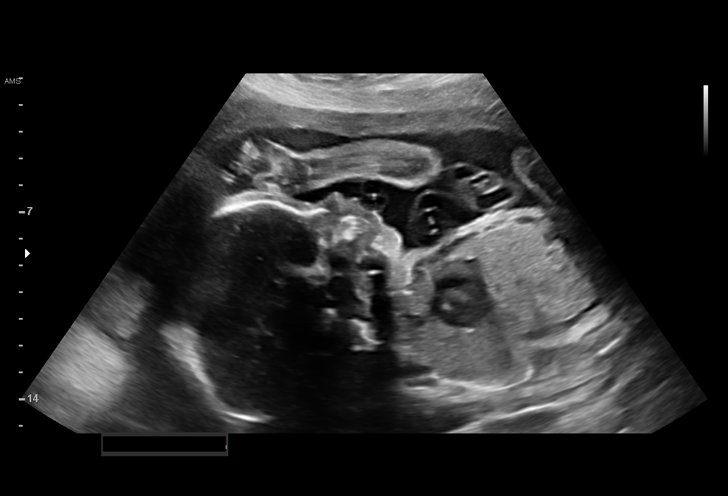
[im 36/38]
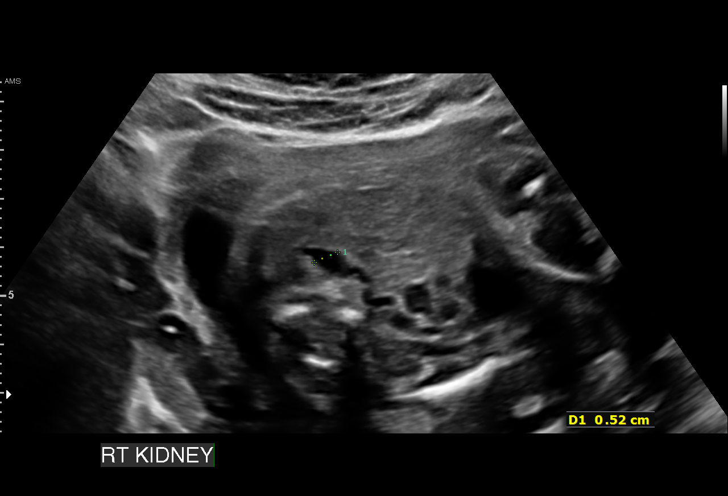

[13 of 28 positions shown; findings below may reference images not displayed]

[facility]

                                                            OB/Gyn Clinic

 ----------------------------------------------------------------------

 ----------------------------------------------------------------------
Indications

  Pyelectasis of fetus on prenatal ultrasound
  Poor obstetric history: Previous fetal growth
  restriction (FGR)
  24 weeks gestation of pregnancy
  Encounter for other antenatal screening
  follow-up
 ----------------------------------------------------------------------
Fetal Evaluation

 Num Of Fetuses:         1
 Fetal Heart Rate(bpm):  151
 Cardiac Activity:       Observed
 Presentation:           Breech
 Placenta:               Posterior

 Amniotic Fluid
 AFI FV:      Within normal limits

                             Largest Pocket(cm)

Biometry

 BPD:      63.4  mm     G. Age:  25w 5d         80  %    CI:        69.48   %    70 - 86
                                                         FL/HC:      19.1   %    18.7 -
 HC:      242.8  mm     G. Age:  26w 3d         88  %    HC/AC:      1.16        1.04 -
 AC:       209   mm     G. Age:  25w 4d         68  %    FL/BPD:     73.2   %    71 - 87
 FL:       46.4  mm     G. Age:  25w 3d         64  %    FL/AC:      22.2   %    20 - 24
 Est. FW:     822  gm    1 lb 13 oz      71  %
OB History

 Gravidity:    2         Term:   1        Prem:   0        SAB:   0
 TOP:          0       Ectopic:  0        Living: 1
Gestational Age

 LMP:           24w 4d        Date:  05/18/18                 EDD:   02/22/19
 U/S Today:     25w 6d                                        EDD:   02/13/19
 Best:          24w 4d     Det. By:  LMP  (05/18/18)          EDD:   02/22/19
Anatomy

 Cranium:               Appears normal         LVOT:                   Appears normal
 Cavum:                 Previously seen        Aortic Arch:            Previously seen
 Ventricles:            Previously seen        Ductal Arch:            Previously seen
 Choroid Plexus:        Previously seen        Diaphragm:              Previously seen
 Cerebellum:            Previously seen        Stomach:                Appears normal, left
                                                                       sided
 Posterior Fossa:       Previously seen        Abdomen:                Appears normal
 Nuchal Fold:           Previously seen        Abdominal Wall:         Previously seen
 Face:                  Orbits appear          Cord Vessels:           Previously seen
                        normal; profile
                        normal
 Lips:                  Previously seen        Kidneys:                Right sided
                                                                       pyelectasis,
                                                                       mm
 Palate:                Previously seen        Bladder:                Appears normal
 Thoracic:              Previously seen        Spine:                  Previously seen
 Heart:                 Appears normal         Upper Extremities:      Previously seen
                        (4CH, axis, and
                        situs)
 RVOT:                  Appears normal         Lower Extremities:      Previously seen

 Other:  Nasal bone visualized previously. Technically difficult due to fetal
         position.
Cervix Uterus Adnexa

 Cervix
 Not visualized (advanced GA >65wks)
Impression

 Amniotic fluid is normal and good fetal activity is seen. Fetal
 growth is appropriate for gestational age. Cardiac anatomy is
 normal. Mild right UTD is seen. We reassured the patient of
 the findings.
Recommendations

 -An appointment was made for her to return in 8 weeks for
 fetal growth and renal assessments.
                 Mahange, Cerene

## 2019-06-24 ENCOUNTER — Emergency Department (HOSPITAL_COMMUNITY)
Admission: EM | Admit: 2019-06-24 | Discharge: 2019-06-25 | Disposition: A | Discharge status: Home / Self Care | Payer: Medicaid Other | Attending: Emergency Medicine | Admitting: Emergency Medicine

## 2019-06-24 ENCOUNTER — Encounter (HOSPITAL_COMMUNITY): Payer: Self-pay | Admitting: Emergency Medicine

## 2019-06-24 ENCOUNTER — Other Ambulatory Visit: Payer: Self-pay

## 2019-06-24 DIAGNOSIS — Z79899 Other long term (current) drug therapy: Secondary | ICD-10-CM | POA: Diagnosis not present

## 2019-06-24 DIAGNOSIS — N390 Urinary tract infection, site not specified: Secondary | ICD-10-CM | POA: Diagnosis not present

## 2019-06-24 DIAGNOSIS — M7918 Myalgia, other site: Secondary | ICD-10-CM | POA: Diagnosis present

## 2019-06-24 DIAGNOSIS — R509 Fever, unspecified: Secondary | ICD-10-CM | POA: Insufficient documentation

## 2019-06-24 DIAGNOSIS — Z20828 Contact with and (suspected) exposure to other viral communicable diseases: Secondary | ICD-10-CM | POA: Insufficient documentation

## 2019-06-24 DIAGNOSIS — R112 Nausea with vomiting, unspecified: Secondary | ICD-10-CM | POA: Insufficient documentation

## 2019-06-24 LAB — URINALYSIS, ROUTINE W REFLEX MICROSCOPIC
Bilirubin Urine: NEGATIVE
Glucose, UA: NEGATIVE mg/dL
Hgb urine dipstick: NEGATIVE
Ketones, ur: 5 mg/dL — AB
Nitrite: NEGATIVE
Protein, ur: NEGATIVE mg/dL
Specific Gravity, Urine: 1.016 (ref 1.005–1.030)
pH: 7 (ref 5.0–8.0)

## 2019-06-24 LAB — COMPREHENSIVE METABOLIC PANEL
ALT: 25 U/L (ref 0–44)
AST: 23 U/L (ref 15–41)
Albumin: 4 g/dL (ref 3.5–5.0)
Alkaline Phosphatase: 62 U/L (ref 38–126)
Anion gap: 8 (ref 5–15)
BUN: 8 mg/dL (ref 6–20)
CO2: 23 mmol/L (ref 22–32)
Calcium: 9.3 mg/dL (ref 8.9–10.3)
Chloride: 104 mmol/L (ref 98–111)
Creatinine, Ser: 0.78 mg/dL (ref 0.44–1.00)
GFR calc Af Amer: >60 mL/min (ref >60)
GFR calc non Af Amer: >60 mL/min (ref >60)
Glucose, Bld: 102 mg/dL — ABNORMAL HIGH (ref 70–99)
Potassium: 3.4 mmol/L — ABNORMAL LOW (ref 3.5–5.1)
Sodium: 135 mmol/L (ref 135–145)
Total Bilirubin: 0.7 mg/dL (ref 0.3–1.2)
Total Protein: 6.9 g/dL (ref 6.5–8.1)

## 2019-06-24 LAB — CBC
HCT: 39.2 % (ref 36.0–46.0)
Hemoglobin: 12.5 g/dL (ref 12.0–15.0)
MCH: 28.3 pg (ref 26.0–34.0)
MCHC: 31.9 g/dL (ref 30.0–36.0)
MCV: 88.7 fL (ref 80.0–100.0)
Platelets: 183 10*3/uL (ref 150–400)
RBC: 4.42 MIL/uL (ref 3.87–5.11)
RDW: 12.7 % (ref 11.5–15.5)
WBC: 10.1 10*3/uL (ref 4.0–10.5)
nRBC: 0 % (ref 0.0–0.2)

## 2019-06-24 LAB — LIPASE, BLOOD: Lipase: 31 U/L (ref 11–51)

## 2019-06-24 LAB — I-STAT BETA HCG BLOOD, ED (MC, WL, AP ONLY): I-stat hCG, quantitative: <5 m[IU]/mL (ref <5)

## 2019-06-24 MED ORDER — ACETAMINOPHEN 325 MG PO TABS
650.0000 mg | ORAL_TABLET | Freq: Once | ORAL | Status: AC | PRN
  Administered 2019-06-24: 20:00:00 650 mg via ORAL
  Filled 2019-06-24: qty 2

## 2019-06-24 MED ORDER — SODIUM CHLORIDE 0.9% FLUSH: 3.0000 mL | Freq: Once | INTRAVENOUS | Status: DC

## 2019-06-24 NOTE — ED Notes (Signed)
Pt was not answering when called to go to a room several time. Was pulled off the floor and came up to nurse asking how long. I advised pt that she was called and would mover the patient back to be seen again.

## 2019-06-24 NOTE — ED Triage Notes (Signed)
Pt states she has had fevers today, generalized bodyaches, and vomiting all day. Denies exposure to covid

## 2019-06-25 MED ORDER — CEPHALEXIN 250 MG PO CAPS
500.0000 mg | ORAL_CAPSULE | Freq: Once | ORAL | Status: AC
  Administered 2019-06-25: 500 mg via ORAL
  Filled 2019-06-25: qty 2

## 2019-06-25 MED ORDER — CEPHALEXIN 500 MG PO CAPS: 500.0000 mg | ORAL_CAPSULE | Freq: Two times a day (BID) | ORAL | 0 refills | Status: AC

## 2019-06-25 MED ORDER — ONDANSETRON 4 MG PO TBDP: 4.0000 mg | ORAL_TABLET | Freq: Three times a day (TID) | ORAL | 0 refills | Status: AC | PRN

## 2019-06-25 MED ORDER — ONDANSETRON 4 MG PO TBDP
4.0000 mg | ORAL_TABLET | Freq: Once | ORAL | Status: AC
  Administered 2019-06-25: 4 mg via ORAL
  Filled 2019-06-25: qty 1

## 2019-06-25 NOTE — Discharge Instructions (Addendum)
Please return to the ER if your symptoms worsen.  Please take antibiotics as directed.  Continue to isolate/quarantine yourself until you have received the result of your COVID test.

## 2019-06-25 NOTE — ED Notes (Signed)
All appropriate discharge materials reviewed with patient at length. Time for questions provided. Pt denies any further questions at this time. Verbalizes understanding of all provided materials.  

## 2019-06-25 NOTE — ED Provider Notes (Signed)
[facility] EMERGENCY DEPARTMENT Provider Note   CSN: [id number] Arrival date & time: 06/24/19  1635     History   Chief Complaint Chief Complaint  Patient presents with  . Generalized Body Aches  . Fever  . Vomiting    HPI Hannah Pearson is a 23 y.o. female.     Patient presents to the emergency department with a chief complaint of generalized body aches.  She complains of some vague abdominal pain and back pain.  She denies any dysuria or hematuria.  She reports some associated nausea and vomiting.  She reports subjective fevers and chills at home.  She denies any cough, shortness of breath, or exposures to coronavirus.  She reports that the onset of her symptoms was today.  She denies any other associated symptoms.  Denies any treatments prior to arrival.  The history is provided by the patient. No language interpreter was used.    Past Medical History:  Diagnosis Date  . Gastritis   . GBS (group B Streptococcus carrier), +RV culture, currently pregnant 01/29/2019  . History of prior pregnancy with IUGR newborn 08/26/2018   IOL at 38 weeks by pt history d/t to IUGR Wt 5 # 14 oz Records requested    Patient Active Problem List   Diagnosis Date Noted  . Language barrier 01/25/2019    Past Surgical History:  Procedure Laterality Date  . CESAREAN SECTION  02/05/2019   Procedure: CESAREAN SECTION;  Surgeon: Allie Bossier, MD;  Location: [facility] LD ORS;  Service: Obstetrics;;  . WOUND EXPLORATION N/A 02/05/2019   Procedure: WOUND EXPLORATION;  Surgeon: Allie Bossier, MD;  Location: [facility] LD ORS;  Service: Gynecology;  Laterality: N/A;     OB History    Gravida  2   Para  2   Term  2   Preterm  0   AB  0   Living  2     SAB  0   TAB  0   Ectopic  0   Multiple  0   Live Births  1            Home Medications    Prior to Admission medications   Medication Sig Start Date End Date Taking? Authorizing Provider  ibuprofen  (ADVIL) 800 MG tablet Take 1 tablet (800 mg total) by mouth 3 (three) times daily. Patient not taking: Reported on 03/08/2019 02/07/19   Tamera Stands, DO  Prenatal Vit-Fe Fumarate-FA (PREPLUS) 27-1 MG TABS Take 1 tablet by mouth daily. 12/16/18   Reva Bores, MD    Family History History reviewed. No pertinent family history.  Social History Social History   Tobacco Use  . Smoking status: Never Smoker  . Smokeless tobacco: Never Used  Substance Use Topics  . Alcohol use: Not Currently    Frequency: Never  . Drug use: Not Currently     Allergies   Patient has no known allergies.   Review of Systems Review of Systems  All other systems reviewed and are negative.    Physical Exam Updated Vital Signs BP 110/64   Pulse 82   Temp 98.4 F (36.9 C) (Axillary)   Resp 16   Ht 5\' 5"  (1.651 m)   Wt 79.4 kg   LMP 06/24/2019   SpO2 100%   BMI 29.12 kg/m   Physical Exam Vitals signs and nursing note reviewed.  Constitutional:      General: She is not in acute distress.  Appearance: She is well-developed.  HENT:     Head: Normocephalic and atraumatic.  Eyes:     Conjunctiva/sclera: Conjunctivae normal.  Neck:     Musculoskeletal: Neck supple.  Cardiovascular:     Rate and Rhythm: Normal rate and regular rhythm.     Heart sounds: No murmur.  Pulmonary:     Effort: Pulmonary effort is normal. No respiratory distress.     Breath sounds: Normal breath sounds.  Abdominal:     Palpations: Abdomen is soft.     Tenderness: There is no abdominal tenderness.  Musculoskeletal: Normal range of motion.  Skin:    General: Skin is warm and dry.  Neurological:     Mental Status: She is alert and oriented to person, place, and time.  Psychiatric:        Mood and Affect: Mood normal.        Behavior: Behavior normal.      ED Treatments / Results  Labs (all labs ordered are listed, but only abnormal results are displayed) Labs Reviewed  COMPREHENSIVE METABOLIC  PANEL - Abnormal; Notable for the following components:      Result Value   Potassium 3.4 (*)    Glucose, Bld 102 (*)    All other components within normal limits  URINALYSIS, ROUTINE W REFLEX MICROSCOPIC - Abnormal; Notable for the following components:   APPearance HAZY (*)    Ketones, ur 5 (*)    Leukocytes,Ua LARGE (*)    Bacteria, UA FEW (*)    All other components within normal limits  LIPASE, BLOOD  CBC  I-STAT BETA HCG BLOOD, ED ([facility], [facility], [facility] ONLY)    EKG None  Radiology No results found.  Procedures Procedures (including critical care time)  Medications Ordered in ED Medications  sodium chloride flush (NS) 0.9 % injection 3 mL (has no administration in time range)  ondansetron (ZOFRAN-ODT) disintegrating tablet 4 mg (has no administration in time range)  cephALEXin (KEFLEX) capsule 500 mg (has no administration in time range)  acetaminophen (TYLENOL) tablet 650 mg (650 mg Oral Given 06/24/19 1955)     Initial Impression / Assessment and Plan / ED Course  I have reviewed the triage vital signs and the nursing notes.  Pertinent labs & imaging results that were available during my care of the patient were reviewed by me and considered in my medical decision making (see chart for details).        Patient is a well-appearing female.  Vital signs are stable.  Complaining of vague abdominal pain and back pain with generalized body aches.  Her urinalysis is suspicious for infection.  Consider cystitis versus early pyelonephritis.  Given that she is afebrile in the ED, and tolerating p.o. in the ED, feel that patient can be discharged home with outpatient treatment.  Will give Zofran and Keflex.  We will also check outpatient coronavirus test.  Patient advised to continue to quarantine until she has the results of her coronavirus test.  Return precautions discussed.  Patient understands and agrees with plan.  Final Clinical Impressions(s) / ED Diagnoses   Final  diagnoses:  Urinary tract infection without hematuria, site unspecified    ED Discharge Orders    None       Montine Circle, PA-C 27/78/24 2353    Delora Fuel, MD 61/44/31 [phone]

## 2019-06-27 LAB — NOVEL CORONAVIRUS, NAA (HOSP ORDER, SEND-OUT TO REF LAB; TAT 18-24 HRS): SARS-CoV-2, NAA: NOT DETECTED

## 2019-07-26 IMAGING — US US MFM OB FOLLOW UP
1 series · 12 of 28 positions shown · non-contrast
Comparison: none

[Series 1: us mfm ob follow up · 12 of 36 slices shown]
[im 2/36]
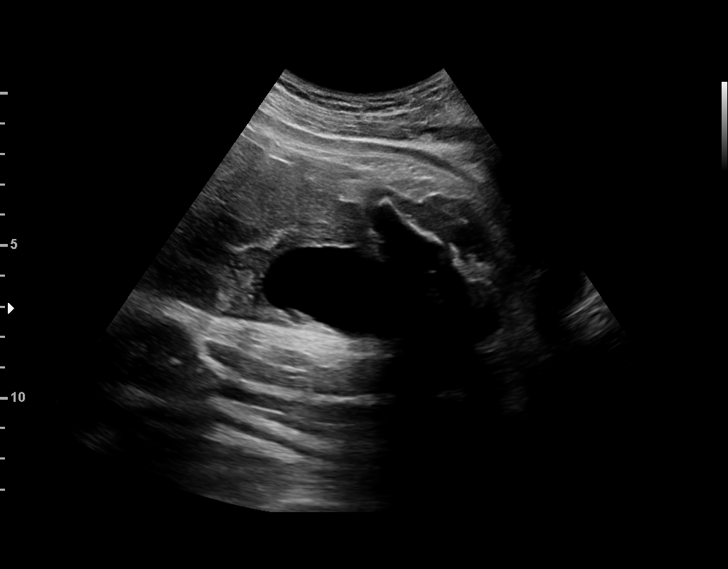
[im 4/36]
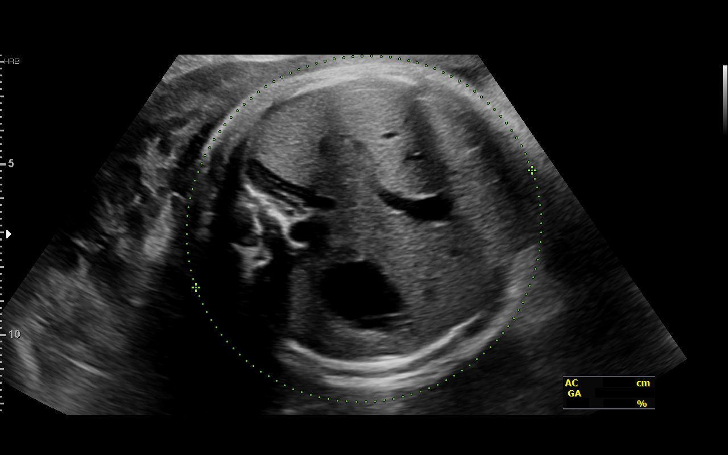
[im 7/36]
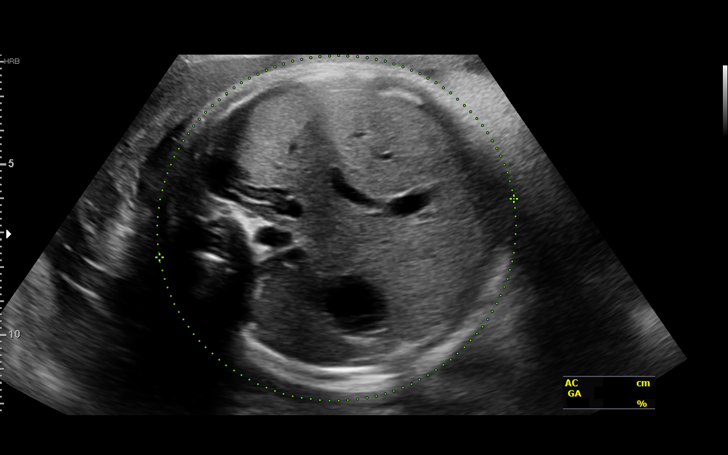
[im 11/36]
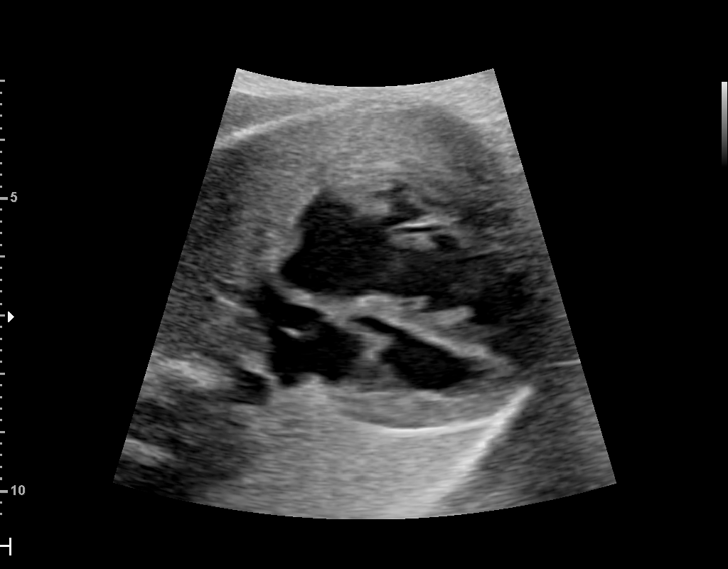
[im 13/36]
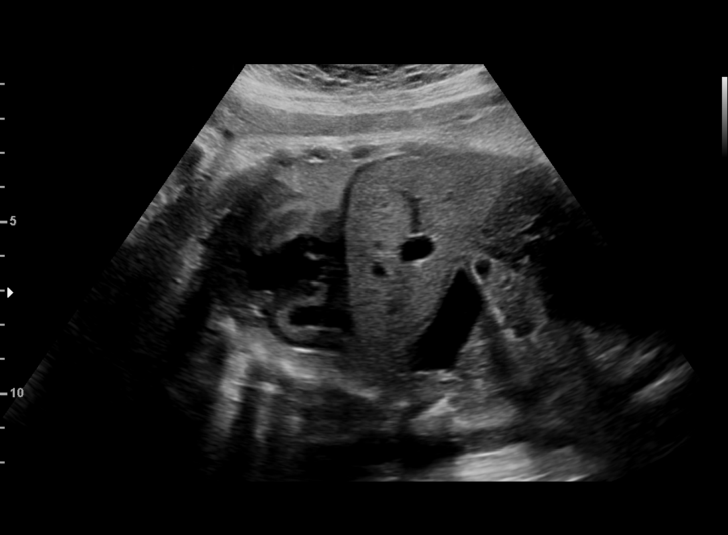
[im 16/36]
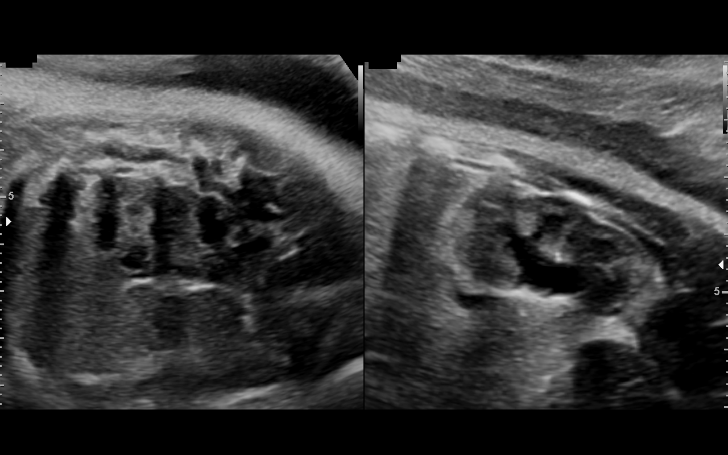
[im 20/36]
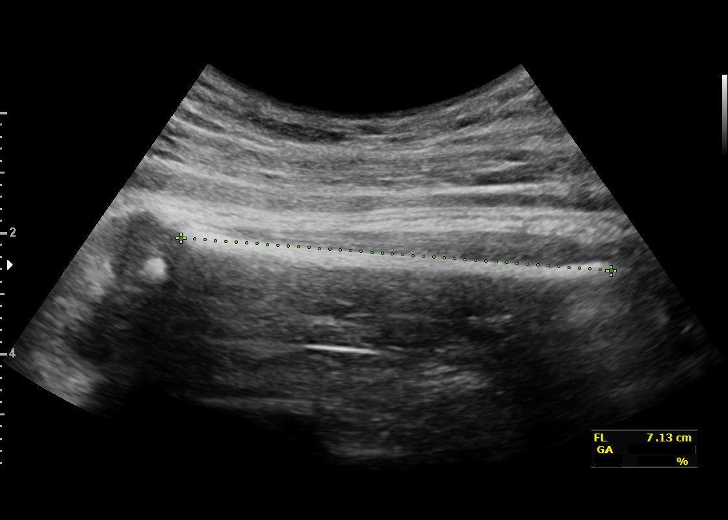
[im 23/36]
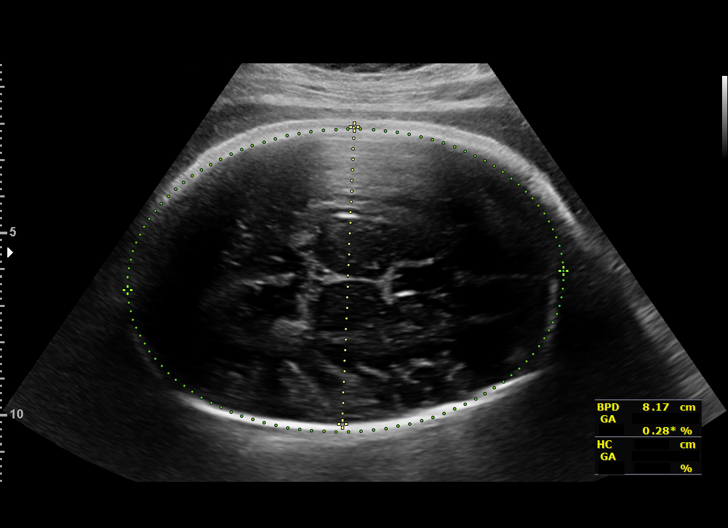
[im 25/36]
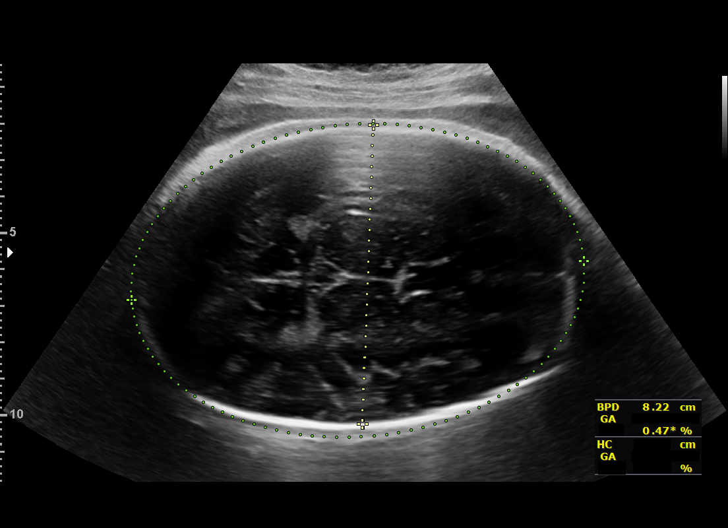
[im 29/36]
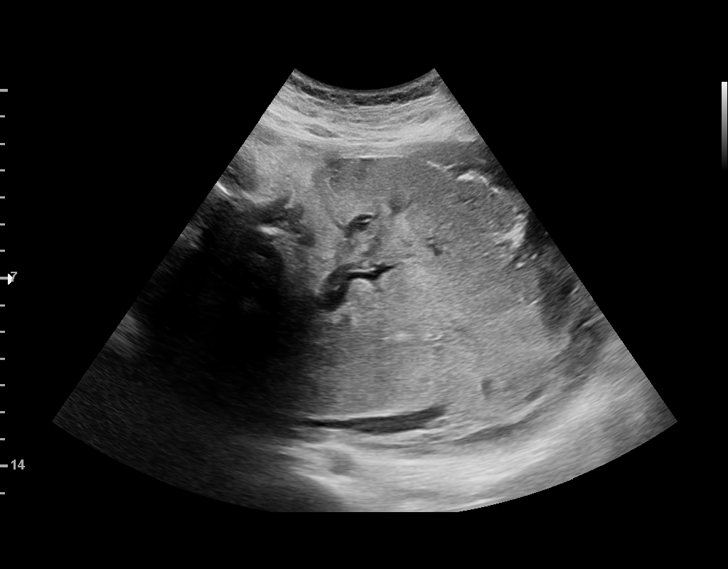
[im 32/36]
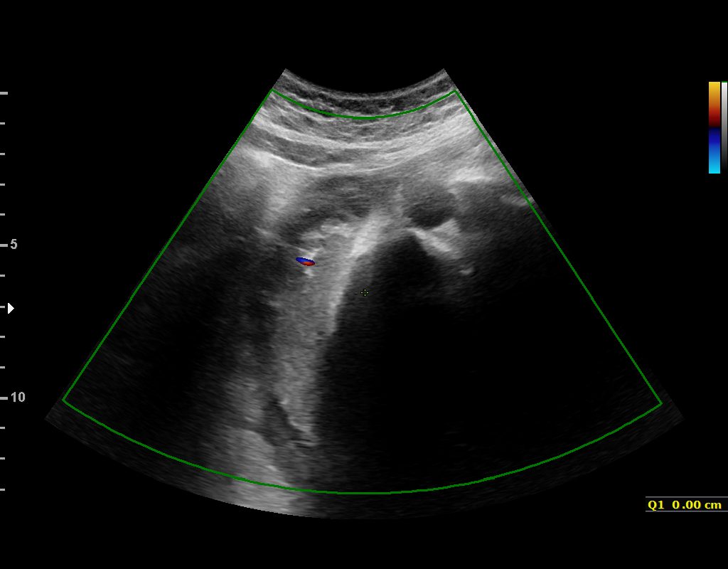
[im 34/36]
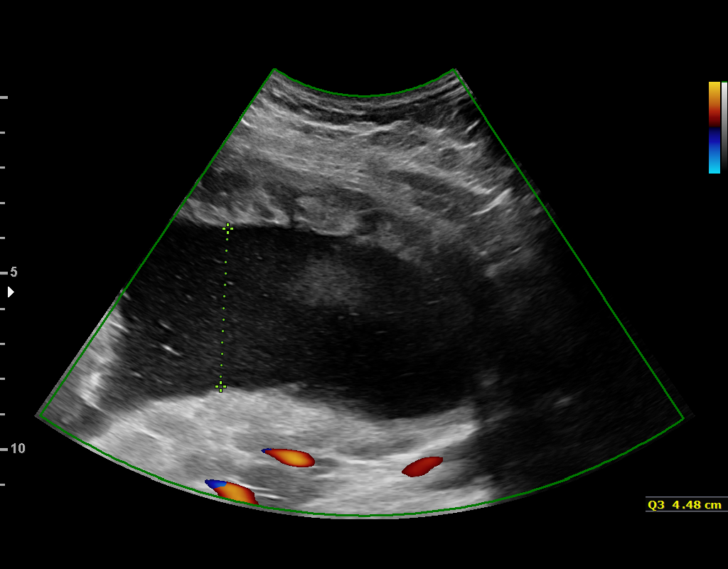

[12 of 28 positions shown; findings below may reference images not displayed]

[place]

                                                            [address]

 ----------------------------------------------------------------------

 ----------------------------------------------------------------------
Indications

  Pyelectasis of fetus on prenatal ultrasound
  Poor obstetric history: Previous fetal growth
  restriction (FGR)
  Encounter for other antenatal screening
  follow-up
  37 weeks gestation of pregnancy
 ----------------------------------------------------------------------
Vital Signs

 BMI:
Fetal Evaluation

 Num Of Fetuses:         1
 Fetal Heart Rate(bpm):  158
 Cardiac Activity:       Observed
 Presentation:           Breech
 Placenta:               Posterior
 P. Cord Insertion:      Previously Visualized

 Amniotic Fluid
 AFI FV:      Within normal limits

 AFI Sum(cm)     %Tile       Largest Pocket(cm)
 10.05           24

 RUQ(cm)       RLQ(cm)       LUQ(cm)        LLQ(cm)
 0
Biometry
 BPD:      82.1  mm     G. Age:  33w 0d        < 1  %    CI:        63.71   %    70 - 86
                                                         FL/HC:      21.7   %    20.8 -
 HC:      331.2  mm     G. Age:  37w 5d         42  %    HC/AC:      1.02        0.92 -
 AC:       324   mm     G. Age:  36w 2d         44  %    FL/BPD:     87.6   %    71 - 87
 FL:       71.9  mm     G. Age:  36w 6d         45  %    FL/AC:      22.2   %    20 - 24
 HUM:        63  mm     G. Age:  36w 4d         61  %

 Est. FW:    0008  gm      6 lb 6 oz     51  %
OB History

 Gravidity:    2         Term:   1        Prem:   0        SAB:   0
 TOP:          0       Ectopic:  0        Living: 1
Gestational Age

 LMP:           37w 0d        Date:  05/18/18                 EDD:   02/22/19
 U/S Today:     36w 0d                                        EDD:   03/01/19
 Best:          37w 0d     Det. By:  LMP  (05/18/18)          EDD:   02/22/19
Anatomy

 Cranium:               Appears normal         LVOT:                   Previously seen
 Cavum:                 Appears normal         Aortic Arch:            Previously seen
 Ventricles:            Appears normal         Ductal Arch:            Previously seen
 Choroid Plexus:        Previously seen        Diaphragm:              Appears normal
 Cerebellum:            Previously seen        Stomach:                Appears normal, left
                                                                       sided
 Posterior Fossa:       Previously seen        Abdomen:                Appears normal
 Nuchal Fold:           Previously seen        Abdominal Wall:         Previously seen
 Face:                  Orbits appear          Cord Vessels:           Previously seen
                        normal; profile
                        normal
 Lips:                  Previously seen        Kidneys:                Resolved UTD
 Palate:                Previously seen        Bladder:                Appears normal
 Thoracic:              Previously seen        Spine:                  Previously seen
 Heart:                 Appears normal         Upper Extremities:      Previously seen
                        (4CH, axis, and
                        situs)
 RVOT:                  Previously seen        Lower Extremities:      Previously seen

 Other:  Nasal bone visualized previously. Technically difficult due to fetal
         position.
Cervix Uterus Adnexa

 Cervix
 Not visualized (advanced GA >84wks)
Impression

 History of fetal growth restriction.
 On today's ultrasound, fetal growth is appropriate for
 gestational age. Amniotic fluid is normal and good fetal
 activity is seen.

 Breech presentation.
Recommendations
 Follow-up as clinically indicated.
                 Sessarego, Jeanrenaud

## 2021-01-04 ENCOUNTER — Ambulatory Visit: Payer: Medicaid Other | Admitting: Obstetrics and Gynecology

## 2021-01-24 ENCOUNTER — Emergency Department (HOSPITAL_COMMUNITY)
Admission: EM | Admit: 2021-01-24 | Discharge: 2021-01-24 | Disposition: A | Discharge status: Home / Self Care | Payer: Medicaid Other | Attending: Emergency Medicine | Admitting: Emergency Medicine

## 2021-01-24 ENCOUNTER — Emergency Department (HOSPITAL_COMMUNITY): Payer: Medicaid Other

## 2021-01-24 ENCOUNTER — Encounter (HOSPITAL_COMMUNITY): Payer: Self-pay

## 2021-01-24 ENCOUNTER — Other Ambulatory Visit: Payer: Self-pay

## 2021-01-24 DIAGNOSIS — N83201 Unspecified ovarian cyst, right side: Secondary | ICD-10-CM | POA: Diagnosis not present

## 2021-01-24 DIAGNOSIS — R102 Pelvic and perineal pain: Secondary | ICD-10-CM | POA: Diagnosis not present

## 2021-01-24 DIAGNOSIS — R1031 Right lower quadrant pain: Secondary | ICD-10-CM | POA: Diagnosis present

## 2021-01-24 DIAGNOSIS — N838 Other noninflammatory disorders of ovary, fallopian tube and broad ligament: Secondary | ICD-10-CM | POA: Diagnosis not present

## 2021-01-24 LAB — CBC WITH DIFFERENTIAL/PLATELET
Abs Immature Granulocytes: 0.03 10*3/uL (ref 0.00–0.07)
Basophils Absolute: 0.1 10*3/uL (ref 0.0–0.1)
Basophils Relative: 1 %
Eosinophils Absolute: 0.3 10*3/uL (ref 0.0–0.5)
Eosinophils Relative: 4 %
HCT: 41.1 % (ref 36.0–46.0)
Hemoglobin: 13 g/dL (ref 12.0–15.0)
Immature Granulocytes: 0 %
Lymphocytes Relative: 25 %
Lymphs Abs: 1.8 10*3/uL (ref 0.7–4.0)
MCH: 29 pg (ref 26.0–34.0)
MCHC: 31.6 g/dL (ref 30.0–36.0)
MCV: 91.5 fL (ref 80.0–100.0)
Monocytes Absolute: 0.6 10*3/uL (ref 0.1–1.0)
Monocytes Relative: 8 %
Neutro Abs: 4.5 10*3/uL (ref 1.7–7.7)
Neutrophils Relative %: 62 %
Platelets: 216 10*3/uL (ref 150–400)
RBC: 4.49 MIL/uL (ref 3.87–5.11)
RDW: 12.4 % (ref 11.5–15.5)
WBC: 7.1 10*3/uL (ref 4.0–10.5)
nRBC: 0 % (ref 0.0–0.2)

## 2021-01-24 LAB — COMPREHENSIVE METABOLIC PANEL
ALT: 19 U/L (ref 0–44)
AST: 19 U/L (ref 15–41)
Albumin: 3.7 g/dL (ref 3.5–5.0)
Alkaline Phosphatase: 58 U/L (ref 38–126)
Anion gap: 4 — ABNORMAL LOW (ref 5–15)
BUN: 11 mg/dL (ref 6–20)
CO2: 26 mmol/L (ref 22–32)
Calcium: 8.9 mg/dL (ref 8.9–10.3)
Chloride: 107 mmol/L (ref 98–111)
Creatinine, Ser: 0.57 mg/dL (ref 0.44–1.00)
GFR, Estimated: >60 mL/min (ref >60)
Glucose, Bld: 87 mg/dL (ref 70–99)
Potassium: 4.1 mmol/L (ref 3.5–5.1)
Sodium: 137 mmol/L (ref 135–145)
Total Bilirubin: 0.6 mg/dL (ref 0.3–1.2)
Total Protein: 6.4 g/dL — ABNORMAL LOW (ref 6.5–8.1)

## 2021-01-24 LAB — I-STAT BETA HCG BLOOD, ED (MC, WL, AP ONLY): I-stat hCG, quantitative: <5 m[IU]/mL (ref <5)

## 2021-01-24 LAB — URINALYSIS, ROUTINE W REFLEX MICROSCOPIC
Bilirubin Urine: NEGATIVE
Glucose, UA: NEGATIVE mg/dL
Ketones, ur: NEGATIVE mg/dL
Nitrite: NEGATIVE
Protein, ur: NEGATIVE mg/dL
Specific Gravity, Urine: 1.02 (ref 1.005–1.030)
pH: 5 (ref 5.0–8.0)

## 2021-01-24 LAB — LIPASE, BLOOD: Lipase: 36 U/L (ref 11–51)

## 2021-01-24 MED ORDER — SODIUM CHLORIDE 0.9 % IV BOLUS: 1000.0000 mL | Freq: Once | INTRAVENOUS | Status: DC

## 2021-01-24 MED ORDER — MORPHINE SULFATE (PF) 4 MG/ML IV SOLN
4.0000 mg | Freq: Once | INTRAVENOUS | Status: DC
Start: 2021-01-24 — End: 2021-01-24

## 2021-01-24 MED ORDER — ONDANSETRON HCL 4 MG/2ML IJ SOLN: 4.0000 mg | Freq: Once | INTRAMUSCULAR | Status: DC

## 2021-01-24 NOTE — ED Provider Notes (Signed)
[facility] EMERGENCY DEPARTMENT Provider Note   CSN: [id number] Arrival date & time: 01/24/21  6568     History Chief Complaint  Patient presents with  . Abdominal Pain    Hannah Pearson is a 25 y.o. female with no significant past medical history who presents for evaluation of abdominal pain.  Began this morning.  Located right lower quadrant.  She describes cramping and aching.  Radiates to her lower back.  No emesis.  She denies any urinary complaints, vaginal discharge or concerns for STDs.  No diarrhea.  She denies chance of pregnancy.  She still has her appendix. Rates her pain a 9/10.  No fever, chills, nausea, vomiting, chest pain, shortness of breath, paresthesias or weakness.  Denies additional rating or alleviating factors. Has appointment on Friday to establish care at [facility].  History obtained from patient and past medical records.  Medical spanish interpreter used  HPI     Past Medical History:  Diagnosis Date  . Gastritis   . GBS (group B Streptococcus carrier), +RV culture, currently pregnant 01/29/2019  . History of prior pregnancy with IUGR newborn 08/26/2018   IOL at 38 weeks by pt history d/t to IUGR Wt 5 # 14 oz Records requested    Patient Active Problem List   Diagnosis Date Noted  . Language barrier 01/25/2019    Past Surgical History:  Procedure Laterality Date  . CESAREAN SECTION  02/05/2019   Procedure: CESAREAN SECTION;  Surgeon: Allie Bossier, MD;  Location: [facility] LD ORS;  Service: Obstetrics;;  . WOUND EXPLORATION N/A 02/05/2019   Procedure: WOUND EXPLORATION;  Surgeon: Allie Bossier, MD;  Location: [facility] LD ORS;  Service: Gynecology;  Laterality: N/A;     OB History    Gravida  2   Para  2   Term  2   Preterm  0   AB  0   Living  2     SAB  0   IAB  0   Ectopic  0   Multiple  0   Live Births  1           History reviewed. No pertinent family history.  Social History    Tobacco Use  . Smoking status: Never Smoker  . Smokeless tobacco: Never Used  Vaping Use  . Vaping Use: Never used  Substance Use Topics  . Alcohol use: Not Currently  . Drug use: Not Currently    Home Medications Prior to Admission medications   Medication Sig Start Date End Date Taking? Authorizing Provider  cephALEXin (KEFLEX) 500 MG capsule Take 1 capsule (500 mg total) by mouth 2 (two) times daily. 06/25/19   Roxy Horseman, PA-C  ibuprofen (ADVIL) 800 MG tablet Take 1 tablet (800 mg total) by mouth 3 (three) times daily. Patient not taking: Reported on 03/08/2019 02/07/19   Tamera Stands, DO  ondansetron (ZOFRAN ODT) 4 MG disintegrating tablet Take 1 tablet (4 mg total) by mouth every 8 (eight) hours as needed for nausea or vomiting. 06/25/19   Roxy Horseman, PA-C  Prenatal Vit-Fe Fumarate-FA (PREPLUS) 27-1 MG TABS Take 1 tablet by mouth daily. 12/16/18   Reva Bores, MD    Allergies    Patient has no known allergies.  Review of Systems   Review of Systems  Constitutional: Negative.   HENT: Negative.   Respiratory: Negative.   Cardiovascular: Negative.   Gastrointestinal: Positive for abdominal pain (RLQ pain). Negative for abdominal distention,  anal bleeding, blood in stool, constipation, diarrhea, nausea and vomiting.  Genitourinary: Negative.   Musculoskeletal: Negative.   Skin: Negative.   Neurological: Negative.   All other systems reviewed and are negative.   Physical Exam Updated Vital Signs BP 106/66 (BP Location: Right Arm)   Pulse 69   Temp 99.2 F (37.3 C) (Oral)   Resp 15   Ht 5\' 5"  (1.651 m)   Wt 79 kg   SpO2 100%   BMI 28.98 kg/m   Physical Exam Vitals and nursing note reviewed.  Constitutional:      General: She is not in acute distress.    Appearance: She is well-developed. She is not ill-appearing, toxic-appearing or diaphoretic.  HENT:     Head: Normocephalic and atraumatic.     Mouth/Throat:     Mouth: Mucous membranes are  moist.  Eyes:     Pupils: Pupils are equal, round, and reactive to light.  Cardiovascular:     Rate and Rhythm: Normal rate.     Heart sounds: Normal heart sounds.  Pulmonary:     Effort: Pulmonary effort is normal. No respiratory distress.     Breath sounds: Normal breath sounds.  Abdominal:     General: There is no distension.     Palpations: Abdomen is soft.     Tenderness: There is abdominal tenderness in the right lower quadrant and suprapubic area. There is no right CVA tenderness, left CVA tenderness, guarding or rebound.     Hernia: No hernia is present.  Musculoskeletal:        General: Normal range of motion.     Cervical back: Normal range of motion.  Skin:    General: Skin is warm and dry.     Capillary Refill: Capillary refill takes less than 2 seconds.  Neurological:     General: No focal deficit present.     Mental Status: She is alert and oriented to person, place, and time.     ED Results / Procedures / Treatments   Labs (all labs ordered are listed, but only abnormal results are displayed) Labs Reviewed  COMPREHENSIVE METABOLIC PANEL - Abnormal; Notable for the following components:      Result Value   Total Protein 6.4 (*)    Anion gap 4 (*)    All other components within normal limits  URINALYSIS, ROUTINE W REFLEX MICROSCOPIC - Abnormal; Notable for the following components:   APPearance CLOUDY (*)    Hgb urine dipstick MODERATE (*)    Leukocytes,Ua LARGE (*)    Bacteria, UA MANY (*)    All other components within normal limits  URINE CULTURE  CBC WITH DIFFERENTIAL/PLATELET  LIPASE, BLOOD  I-STAT BETA HCG BLOOD, ED ([facility], [facility], [facility] ONLY)    EKG None  Radiology PELVIC COMPLETE W TRANSVAGINAL AND TORSION R/O  Result Date: 01/24/2021 CLINICAL DATA:  Pelvic pain EXAM: TRANSABDOMINAL AND TRANSVAGINAL ULTRASOUND OF PELVIS DOPPLER ULTRASOUND OF OVARIES TECHNIQUE: Study was performed transabdominally to optimize pelvic field of view evaluation and  transvaginally to optimize internal visceral architecture evaluation. Color and duplex Doppler ultrasound was utilized to evaluate blood flow to the ovaries. COMPARISON:  None. FINDINGS: Uterus Measurements: 8.4 x 4.5 x 6.0 cm = volume: 118.5 mL. No fibroids or other mass visualized. Endometrium Thickness: 2 mm.  No focal abnormality visualized. Right ovary Measurements: 6.2 x 5.4 x 4.7 cm = volume: 83.2 mL. There is a cystic mass arising from the right ovary measuring 5.3 x 5.0 x 4.1  cm. No other right-sided pelvic mass evident. Left ovary Measurements: 4.5 x 1.8 x 2.8 cm = volume: 11.7 mL. There is a probable dominant follicle in the left ovary measuring 1.9 x 1.0 x 1.0 cm. No other left-sided pelvic mass. Pulsed Doppler evaluation of both ovaries demonstrates normal low-resistance arterial and venous waveforms. Other findings No abnormal free fluid. IMPRESSION: 1. Cystic mass arising from right ovary measuring 5.3 x 5.0 x 4.1 cm, most likely a simple cyst. Recommend follow-up US in 3-6 months per consensus guidelines. Note: This recommendation does not apply to premenarchal patients or to those with increased risk (genetic, family history, elevated tumor markers or other high-risk factors) of ovarian cancer. Reference: Radiology 2019 Nov; 293(2):359-371. 2.  Probable physiologic follicle left ovary. 3.  No intrauterine mass.  Endometrium normal in thickness. 4. Low resistance waveform in each ovary. No findings which are indicative of ovarian torsion currently. Note that the cyst on the right potentially places right ovary at increased risk for torsion. Electronically Signed   By: Bretta Bang III M.D.   On: 01/24/2021 10:11    Procedures Procedures   Medications Ordered in ED Medications - No data to display  ED Course  I have reviewed the triage vital signs and the nursing notes.  Pertinent labs & imaging results that were available during my care of the patient were reviewed by me and  considered in my medical decision making (see chart for details).   25 year old here for evaluation of right lower quadrant pain which began this morning.  She is afebrile, nonseptic, not ill-appearing.  She appears uncomfortable.  She denies any concerns for pregnancy, STD.  Describes her pain as sharp and stabbing.    Work-up started from triage which I personally reviewed and interpret:  CBC without leukocytosis no metabolic panel without electrolyte, renal or normality Lipase 36 Pregnancy test negative UA with moderate blood, large leuks, many bacteria however dirty catch, sent for culture.  No current UTI symptoms, hold on abx [place] with 5 cm likely cyst. No current torsion.  Patient reassessed.  Does admit to some generalized achiness to her right lower quadrant.  She has not wanted anything for pain here in the ED.  Discussed ultrasound with cyst.  There is currently good blood flow and no evidence of torsion.  She appears comfortable on exam.  Repeat benign abdominal exam.  Discussed with patient strict return precautions specifically that large cysts can cause ovarian torsion this would be our greatest concern if her pain increased. She has appointment to establish care on Friday.  Have low suspicion for appendicitis, renal stone, current torsion. Discussed close follow-up with OB/GYN or return here to the ED for any new or worsening symptoms.    Patient is nontoxic, nonseptic appearing, in no apparent distress.  Patient's pain and other symptoms adequately managed in emergency department.  Fluid bolus given.  Labs, imaging and vitals reviewed.  Patient does not meet the SIRS or Sepsis criteria.  On repeat exam patient does not have a surgical abdomin and there are no peritoneal signs.  No indication of appendicitis, bowel obstruction, bowel perforation, cholecystitis, diverticulitis,TOA, intermittent/Torsion, PID or ectopic pregnancy.     Discussed patient with attending, Dr. Charm Barges who  agrees above treatment, plan and disposition     MDM Rules/Calculators/A&P                           Final Clinical Impression(s) / ED Diagnoses  Final diagnoses:  Cyst of right ovary    Rx / DC Orders ED Discharge Orders         Ordered    Ambulatory referral to Obstetrics / Gynecology       Comments: Large right ovarian cyst   01/24/21 1039           Mechille Varghese A, PA-C 01/24/21 1042    Terrilee FilesButler, Michael C, MD 01/24/21 2114

## 2021-01-24 NOTE — ED Triage Notes (Signed)
Emergency Medicine Provider Triage Evaluation Note  Hannah Pearson , a 25 y.o. female  was evaluated in triage.  Pt complains of right lower quadrant abdominal pain.  Began this morning.  Described as sharp and stabbing.  Radiates into lower back.  No emesis.  No urinary complaints.  Still has appendix.  No diarrhea.  Denies chance of pregnancy, vaginal discharge  Review of Systems  Positive: Lower quadrant pain Negative: Nausea, vomiting, diarrhea  Physical Exam  BP 111/69   Pulse 82   Temp 98.5 F (36.9 C) (Oral)   Resp 16   Ht 5\' 5"  (1.651 m)   Wt 79 kg   SpO2 100%   BMI 28.98 kg/m  Gen:   Awake, appears uncomfortable HEENT:  Atraumatic  Resp:  Normal effort  Cardiac:  Normal rate  Abd:   Nondistended,  tenderness to right lower quadrant MSK:   Moves extremities without difficulty  Neuro:  Speech clear   Medical Decision Making  Medically screening exam initiated at 7:23 AM.  Appropriate orders placed.  Hannah Pearson was informed that the remainder of the evaluation will be completed by another provider, this initial triage assessment does not replace that evaluation, and the importance of remaining in the ED until their evaluation is complete.  Clinical Impression  Stable  DDX-Appendicitis, UTI, pyelonephritis, torsion, PID   [place] Shannahan A, PA-C 01/24/21 01/26/21

## 2021-01-24 NOTE — Discharge Instructions (Signed)
Su ecografa mostr un gran quiste ovrico. Actualmente hay un buen flujo de sangre a esto. El tamao hace que el quiste tenga un mayor riesgo de torcerse sobre s mismo y disminuir el flujo de sangre al ovario. Regrese si su dolor aumenta. Puede tomar tylenol e ibuprofeno en casa.

## 2021-01-24 NOTE — ED Triage Notes (Signed)
RLQ pain started today. Nausea yesterday. Denies painful urination.

## 2021-01-25 ENCOUNTER — Telehealth: Payer: Self-pay | Admitting: *Deleted

## 2021-01-25 NOTE — Telephone Encounter (Signed)
Transition Care Management Follow-up Telephone Call  Date of discharge and from where: 01/24/2021 - [facility]  How have you been since you were released from the hospital? "Still in pain"  Any questions or concerns? No  Items Reviewed:  Did the pt receive and understand the discharge instructions provided? Yes   Medications obtained and verified? Yes   Other? No   Any new allergies since your discharge? No   Dietary orders reviewed? No  Do you have support at home? Yes    Functional Questionnaire: (I = Independent and D = Dependent) ADLs: I  Bathing/Dressing- I  Meal Prep- I  Eating- I  Maintaining continence- I  Transferring/Ambulation- I  Managing Meds- I  Follow up appointments reviewed:   PCP Hospital f/u appt confirmed? No   - per the pt, she has an appointment to establish with [facility] on 01/26/2021.  Specialist Hospital f/u appt confirmed? Referral was placed for OBGYN by the ED staff.  Are transportation arrangements needed? No   If their condition worsens, is the pt aware to call PCP or go to the Emergency Dept.? Yes  Was the patient provided with contact information for the PCP's office or ED? Yes  Was to pt encouraged to call back with questions or concerns? Yes  Call was completed with Spanish interpreter services

## 2021-01-26 DIAGNOSIS — Z13220 Encounter for screening for lipoid disorders: Secondary | ICD-10-CM | POA: Diagnosis not present

## 2021-01-26 DIAGNOSIS — J029 Acute pharyngitis, unspecified: Secondary | ICD-10-CM | POA: Diagnosis not present

## 2021-01-26 DIAGNOSIS — Z13228 Encounter for screening for other metabolic disorders: Secondary | ICD-10-CM | POA: Diagnosis not present

## 2021-01-26 DIAGNOSIS — Z20822 Contact with and (suspected) exposure to covid-19: Secondary | ICD-10-CM | POA: Diagnosis not present

## 2021-01-26 DIAGNOSIS — N83201 Unspecified ovarian cyst, right side: Secondary | ICD-10-CM | POA: Diagnosis not present

## 2021-01-26 DIAGNOSIS — R509 Fever, unspecified: Secondary | ICD-10-CM | POA: Diagnosis not present

## 2021-01-26 DIAGNOSIS — Z1159 Encounter for screening for other viral diseases: Secondary | ICD-10-CM | POA: Diagnosis not present

## 2021-01-26 DIAGNOSIS — Z0000 Encounter for general adult medical examination without abnormal findings: Secondary | ICD-10-CM | POA: Diagnosis not present

## 2021-01-26 DIAGNOSIS — E559 Vitamin D deficiency, unspecified: Secondary | ICD-10-CM | POA: Diagnosis not present

## 2021-01-26 DIAGNOSIS — Z131 Encounter for screening for diabetes mellitus: Secondary | ICD-10-CM | POA: Diagnosis not present

## 2021-01-28 LAB — URINE CULTURE: Culture: >=100000 — AB

## 2021-01-29 ENCOUNTER — Telehealth: Payer: Self-pay | Admitting: Emergency Medicine

## 2021-01-29 NOTE — Telephone Encounter (Signed)
Post ED Visit - Positive Culture Follow-up  Culture report reviewed by antimicrobial stewardship pharmacist: [facility] []  , Pharm.D. []  Enzo Bi, Pharm.D., BCPS AQ-ID []  , Pharm.D., BCPS []  Celedonio Miyamoto, Pharm.D., BCPS []  [place], Garvin Fila.D., BCPS, AAHIVP []  , Pharm.D., BCPS, AAHIVP []  Georgina Pillion, PharmD, BCPS []  , PharmD, BCPS []  [place], PharmD, BCPS []  [address], PharmD []  , PharmD, BCPS []  Estella Husk, PharmD  Pharmacy Team []  Lysle Pearl, PharmD []  , PharmD []  Phillips Climes, PharmD []  , Rph []  Agapito Games) , PharmD []  Verlan Friends, PharmD []  , PharmD []  Mervyn Gay, PharmD []  , PharmD []  Vinnie Level, PharmD []  [facility], PharmD []  , PharmD []  Len Childs, PharmD   Positive urine culture Treated with none, to followup with PCP, no further patient follow-up is required at this time.  01/29/2021, 12:30 PM

## 2021-02-08 DIAGNOSIS — J302 Other seasonal allergic rhinitis: Secondary | ICD-10-CM | POA: Diagnosis not present

## 2021-02-08 DIAGNOSIS — Z6827 Body mass index (BMI) 27.0-27.9, adult: Secondary | ICD-10-CM | POA: Diagnosis not present

## 2021-02-08 DIAGNOSIS — E559 Vitamin D deficiency, unspecified: Secondary | ICD-10-CM | POA: Diagnosis not present

## 2021-02-08 DIAGNOSIS — E663 Overweight: Secondary | ICD-10-CM | POA: Diagnosis not present

## 2021-02-20 DIAGNOSIS — N83201 Unspecified ovarian cyst, right side: Secondary | ICD-10-CM | POA: Diagnosis not present

## 2021-02-20 DIAGNOSIS — N83291 Other ovarian cyst, right side: Secondary | ICD-10-CM | POA: Diagnosis not present

## 2021-03-06 DIAGNOSIS — Z20822 Contact with and (suspected) exposure to covid-19: Secondary | ICD-10-CM | POA: Diagnosis not present

## 2021-03-12 ENCOUNTER — Ambulatory Visit: Payer: Medicaid Other | Admitting: Obstetrics and Gynecology

## 2021-03-27 DIAGNOSIS — J019 Acute sinusitis, unspecified: Secondary | ICD-10-CM | POA: Diagnosis not present

## 2021-03-27 DIAGNOSIS — R509 Fever, unspecified: Secondary | ICD-10-CM | POA: Diagnosis not present

## 2021-03-27 DIAGNOSIS — B9689 Other specified bacterial agents as the cause of diseases classified elsewhere: Secondary | ICD-10-CM | POA: Diagnosis not present

## 2021-05-21 DIAGNOSIS — Z975 Presence of (intrauterine) contraceptive device: Secondary | ICD-10-CM | POA: Diagnosis not present

## 2021-05-21 DIAGNOSIS — N83291 Other ovarian cyst, right side: Secondary | ICD-10-CM | POA: Diagnosis not present

## 2021-05-23 DIAGNOSIS — N8301 Follicular cyst of right ovary: Secondary | ICD-10-CM | POA: Diagnosis not present

## 2021-05-23 DIAGNOSIS — N83291 Other ovarian cyst, right side: Secondary | ICD-10-CM | POA: Diagnosis not present

## 2021-05-23 DIAGNOSIS — N736 Female pelvic peritoneal adhesions (postinfective): Secondary | ICD-10-CM | POA: Diagnosis not present

## 2021-05-23 DIAGNOSIS — N83201 Unspecified ovarian cyst, right side: Secondary | ICD-10-CM | POA: Diagnosis not present

## 2021-07-03 DIAGNOSIS — N83201 Unspecified ovarian cyst, right side: Secondary | ICD-10-CM | POA: Diagnosis not present

## 2021-07-03 DIAGNOSIS — Z1159 Encounter for screening for other viral diseases: Secondary | ICD-10-CM | POA: Diagnosis not present

## 2021-07-03 DIAGNOSIS — R300 Dysuria: Secondary | ICD-10-CM | POA: Diagnosis not present

## 2021-07-03 DIAGNOSIS — E559 Vitamin D deficiency, unspecified: Secondary | ICD-10-CM | POA: Diagnosis not present

## 2021-07-03 DIAGNOSIS — Z6827 Body mass index (BMI) 27.0-27.9, adult: Secondary | ICD-10-CM | POA: Diagnosis not present

## 2021-07-03 DIAGNOSIS — N390 Urinary tract infection, site not specified: Secondary | ICD-10-CM | POA: Diagnosis not present

## 2021-07-18 IMAGING — US US PELVIS COMPLETE TRANSABD/TRANSVAG W DUPLEX
1 series · 13 of 25 positions shown · non-contrast
Comparison: None.

CLINICAL DATA: Pelvic pain

EXAM:
TRANSABDOMINAL AND TRANSVAGINAL ULTRASOUND OF PELVIS
DOPPLER ULTRASOUND OF OVARIES
TECHNIQUE: Study was performed transabdominally to optimize pelvic field of
view evaluation and transvaginally to optimize internal visceral
architecture evaluation.
Color and duplex Doppler ultrasound was utilized to evaluate blood
flow to the ovaries.

[Series 1: us pelvic complete w transvaginal and torsion righ · 13 of 139 slices shown]
[im 1/139]
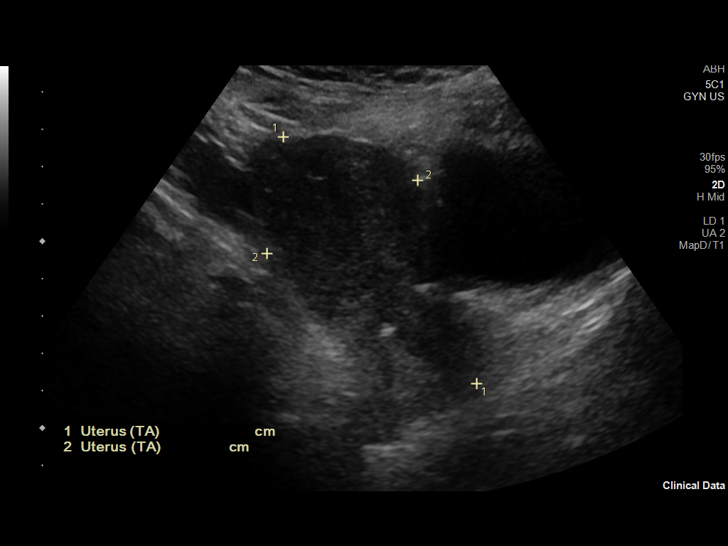
[im 12/139]
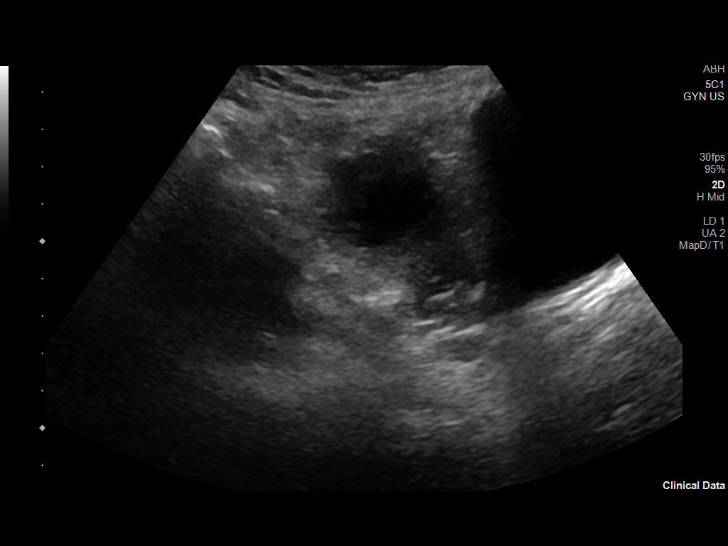
[im 24/139]
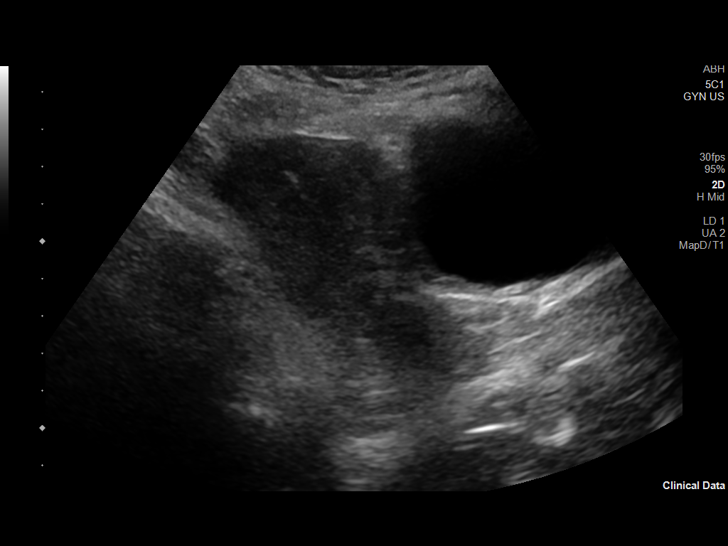
[im 35/139]
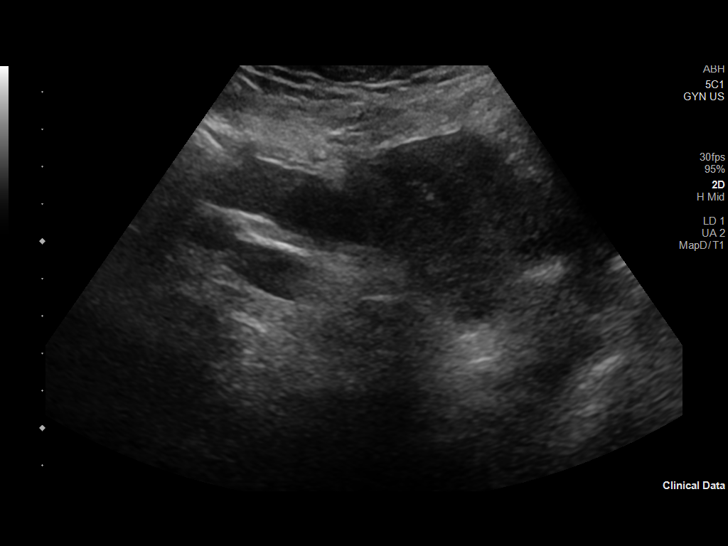
[im 47/139]
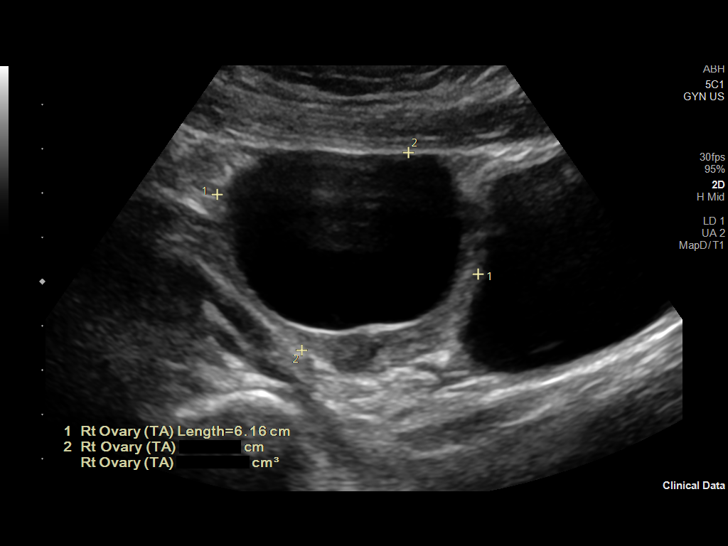
[im 58/139]
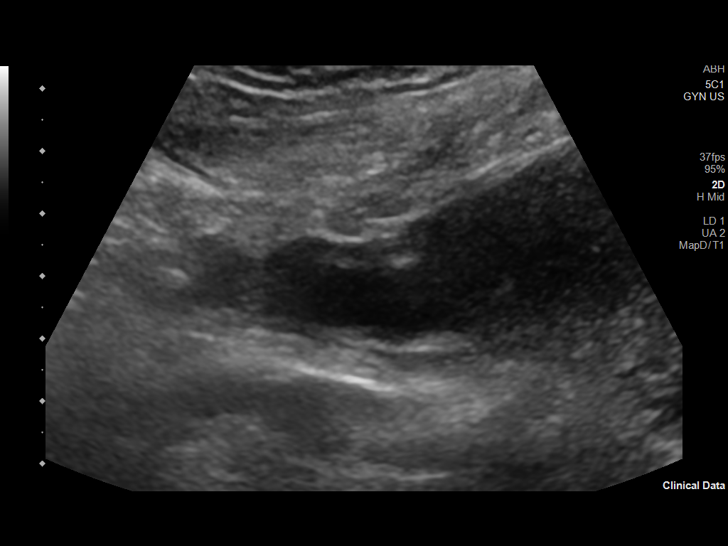
[im 70/139]
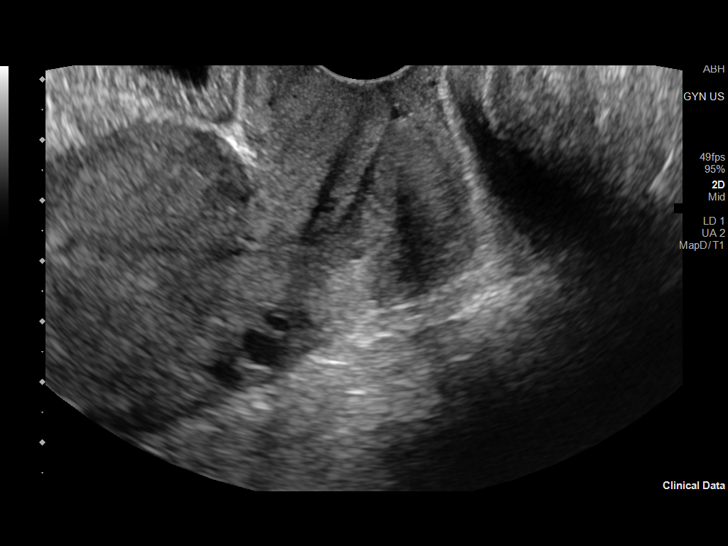
[im 81/139]
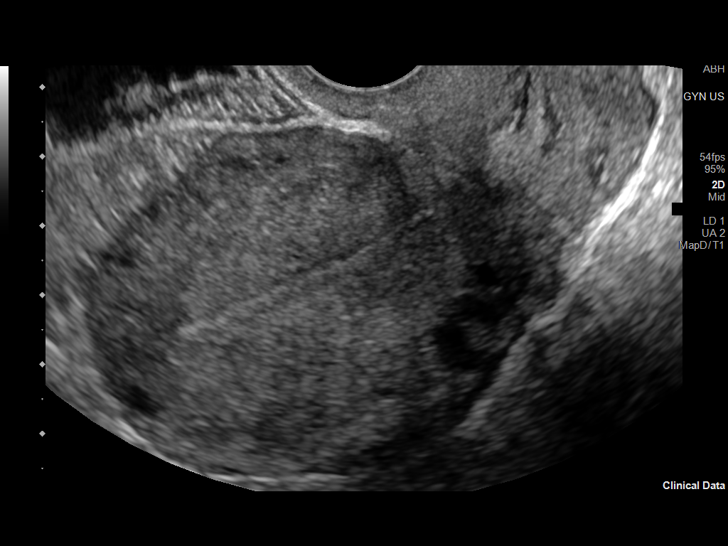
[im 93/139]
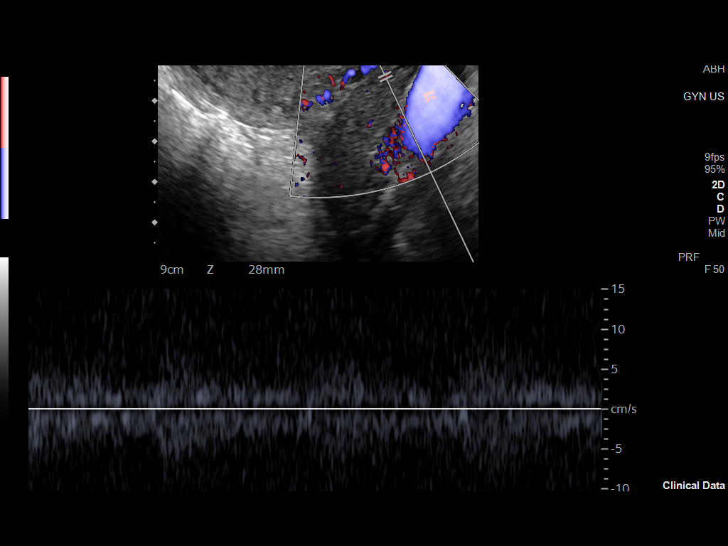
[im 104/139]
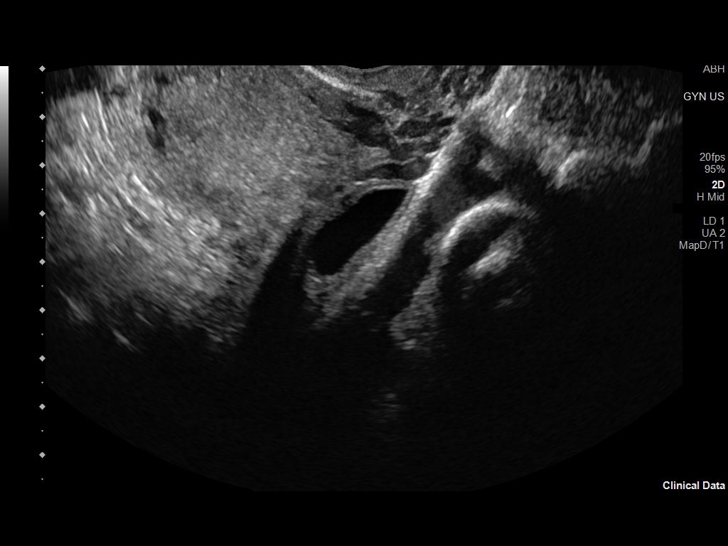
[im 116/139]
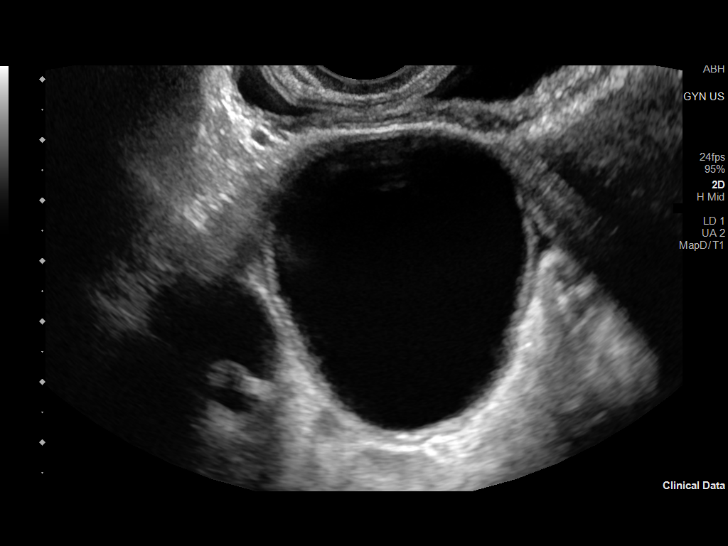
[im 127/139]
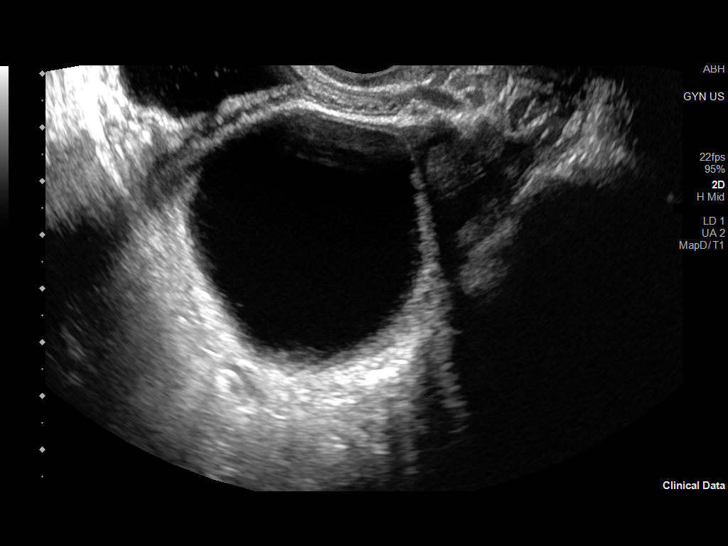
[im 139/139]
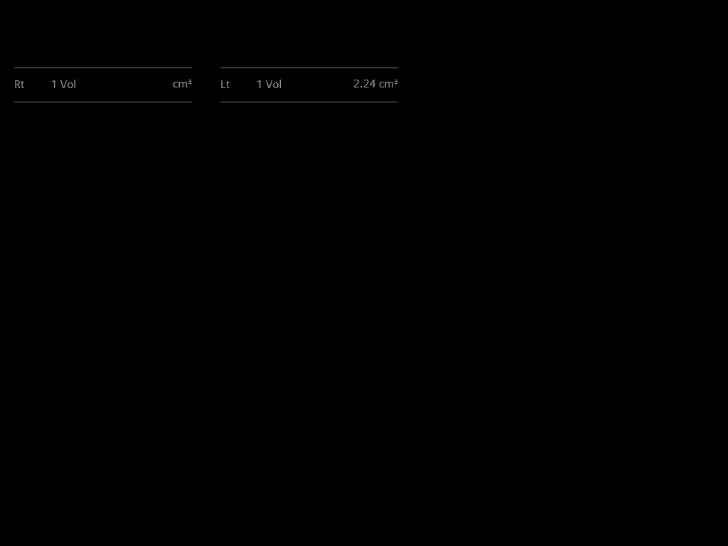

[13 of 25 positions shown; findings below may reference images not displayed]

FINDINGS: Uterus

Measurements: 8.4 x 4.5 x 6.0 cm = volume: 118.5 mL. No fibroids or
other mass visualized.

Endometrium

Thickness: 2 mm.  No focal abnormality visualized.

Right ovary

Measurements: 6.2 x 5.4 x 4.7 cm = volume: 83.2 mL. There is a
cystic mass arising from the right ovary measuring 5.3 x 5.0 x
cm. No other right-sided pelvic mass evident.

Left ovary

Measurements: 4.5 x 1.8 x 2.8 cm = volume: 11.7 mL. There is a
probable dominant follicle in the left ovary measuring 1.9 x 1.0 x
1.0 cm. No other left-sided pelvic mass.

Pulsed Doppler evaluation of both ovaries demonstrates normal
low-resistance arterial and venous waveforms.

Other findings

No abnormal free fluid.
IMPRESSION: 1. Cystic mass arising from right ovary measuring 5.3 x 5.0 x
cm, most likely a simple cyst. Recommend follow-up US in 3-6 months
per consensus guidelines. Note: This recommendation does not apply
to premenarchal patients or to those with increased risk (genetic,
family history, elevated tumor markers or other high-risk factors)
of ovarian cancer. Reference: Radiology [DATE]):359-371.

2.  Probable physiologic follicle left ovary.

3.  No intrauterine mass.  Endometrium normal in thickness.

4. Low resistance waveform in each ovary. No findings which are
indicative of ovarian torsion currently. Note that the cyst on the
right potentially places right ovary at increased risk for torsion.

## 2021-08-26 ENCOUNTER — Emergency Department (HOSPITAL_COMMUNITY)
Admission: EM | Admit: 2021-08-26 | Discharge: 2021-08-26 | Disposition: A | Discharge status: Home / Self Care | Payer: Medicaid Other | Attending: Emergency Medicine | Admitting: Emergency Medicine

## 2021-08-26 ENCOUNTER — Other Ambulatory Visit: Payer: Self-pay

## 2021-08-26 ENCOUNTER — Emergency Department (HOSPITAL_COMMUNITY): Payer: Medicaid Other

## 2021-08-26 ENCOUNTER — Encounter (HOSPITAL_COMMUNITY): Payer: Self-pay | Admitting: Emergency Medicine

## 2021-08-26 DIAGNOSIS — N83201 Unspecified ovarian cyst, right side: Secondary | ICD-10-CM | POA: Diagnosis not present

## 2021-08-26 DIAGNOSIS — R109 Unspecified abdominal pain: Secondary | ICD-10-CM | POA: Diagnosis present

## 2021-08-26 DIAGNOSIS — R1110 Vomiting, unspecified: Secondary | ICD-10-CM | POA: Diagnosis not present

## 2021-08-26 DIAGNOSIS — R102 Pelvic and perineal pain: Secondary | ICD-10-CM | POA: Diagnosis not present

## 2021-08-26 HISTORY — DX: Unspecified ovarian cyst, unspecified side: N83.209

## 2021-08-26 LAB — URINALYSIS, ROUTINE W REFLEX MICROSCOPIC
Bilirubin Urine: NEGATIVE
Glucose, UA: NEGATIVE mg/dL
Hgb urine dipstick: NEGATIVE
Ketones, ur: NEGATIVE mg/dL
Leukocytes,Ua: NEGATIVE
Nitrite: NEGATIVE
Protein, ur: NEGATIVE mg/dL
Specific Gravity, Urine: 1.021 (ref 1.005–1.030)
pH: 7 (ref 5.0–8.0)

## 2021-08-26 LAB — CBC WITH DIFFERENTIAL/PLATELET
Abs Immature Granulocytes: 0.06 10*3/uL (ref 0.00–0.07)
Basophils Absolute: 0.1 10*3/uL (ref 0.0–0.1)
Basophils Relative: 1 %
Eosinophils Absolute: 0.2 10*3/uL (ref 0.0–0.5)
Eosinophils Relative: 2 %
HCT: 41.7 % (ref 36.0–46.0)
Hemoglobin: 13.3 g/dL (ref 12.0–15.0)
Immature Granulocytes: 1 %
Lymphocytes Relative: 26 %
Lymphs Abs: 2.8 10*3/uL (ref 0.7–4.0)
MCH: 28.8 pg (ref 26.0–34.0)
MCHC: 31.9 g/dL (ref 30.0–36.0)
MCV: 90.3 fL (ref 80.0–100.0)
Monocytes Absolute: 0.8 10*3/uL (ref 0.1–1.0)
Monocytes Relative: 7 %
Neutro Abs: 6.9 10*3/uL (ref 1.7–7.7)
Neutrophils Relative %: 63 %
Platelets: 275 10*3/uL (ref 150–400)
RBC: 4.62 MIL/uL (ref 3.87–5.11)
RDW: 12.6 % (ref 11.5–15.5)
WBC: 10.8 10*3/uL — ABNORMAL HIGH (ref 4.0–10.5)
nRBC: 0 % (ref 0.0–0.2)

## 2021-08-26 LAB — COMPREHENSIVE METABOLIC PANEL
ALT: 12 U/L (ref 0–44)
AST: 14 U/L — ABNORMAL LOW (ref 15–41)
Albumin: 3.8 g/dL (ref 3.5–5.0)
Alkaline Phosphatase: 63 U/L (ref 38–126)
Anion gap: 6 (ref 5–15)
BUN: 11 mg/dL (ref 6–20)
CO2: 26 mmol/L (ref 22–32)
Calcium: 9.1 mg/dL (ref 8.9–10.3)
Chloride: 104 mmol/L (ref 98–111)
Creatinine, Ser: 0.81 mg/dL (ref 0.44–1.00)
GFR, Estimated: >60 mL/min (ref >60)
Glucose, Bld: 85 mg/dL (ref 70–99)
Potassium: 4 mmol/L (ref 3.5–5.1)
Sodium: 136 mmol/L (ref 135–145)
Total Bilirubin: 0.6 mg/dL (ref 0.3–1.2)
Total Protein: 6.6 g/dL (ref 6.5–8.1)

## 2021-08-26 LAB — I-STAT BETA HCG BLOOD, ED (MC, WL, AP ONLY): I-stat hCG, quantitative: <5 m[IU]/mL (ref <5)

## 2021-08-26 LAB — LIPASE, BLOOD: Lipase: 37 U/L (ref 11–51)

## 2021-08-26 MED ORDER — IOHEXOL 350 MG/ML SOLN
100.0000 mL | Freq: Once | INTRAVENOUS | Status: AC | PRN
  Administered 2021-08-26: 18:00:00 100 mL via INTRAVENOUS

## 2021-08-26 MED ORDER — IBUPROFEN 600 MG PO TABS: 600.0000 mg | ORAL_TABLET | Freq: Four times a day (QID) | ORAL | 0 refills | Status: AC | PRN

## 2021-08-26 MED ORDER — KETOROLAC TROMETHAMINE 30 MG/ML IJ SOLN
30.0000 mg | Freq: Once | INTRAMUSCULAR | Status: AC
  Administered 2021-08-26: 18:00:00 30 mg via INTRAVENOUS
  Filled 2021-08-26: qty 1

## 2021-08-26 NOTE — ED Triage Notes (Signed)
C/o RLQ pain x 2 days with dysuria.  Denies nausea, vomiting, and diarrhea.  States she had surgery in August for an ovarian cyst and the pain is similar.

## 2021-08-26 NOTE — Discharge Instructions (Signed)
Your work-up in the emergency department today included a CT scan of your abdomen.  The scan showed that you have another cyst on your right ovary.  This measures 4.7 cm.  This is likely causing your pain.  We were able to treat your pain with the medicine in the ER.  I prescribed ibuprofen to take at home for pain, as needed.  It is very important that you contact the surgeons who performed your last cyst procedure.  You should see them again in the office and talk about your new cyst.  I included a print out copy of your report to bring to the office.

## 2021-08-26 NOTE — ED Provider Notes (Signed)
Emergency Medicine Provider Triage Evaluation Note  Hannah Pearson , a 25 y.o. female  was evaluated in triage.  Pt complains of abd pain.  Review of Systems  Positive: RLQ pain Negative: N/v/d, fever, dysuria, hematuria, vaginal bleeding, vaginal discharge  Physical Exam  BP 113/64 (BP Location: Right Arm)   Pulse 81   Temp 98.4 F (36.9 C)   Resp 17   LMP 07/27/2021   SpO2 100%  Gen:   Awake, no distress   Resp:  Normal effort  MSK:   Moves extremities without difficulty  Other:    Medical Decision Making  Medically screening exam initiated at 2:36 PM.  Appropriate orders placed.  Hannah Pearson was informed that the remainder of the evaluation will be completed by another provider, this initial triage assessment does not replace that evaluation, and the importance of remaining in the ED until their evaluation is complete.  RLQ pain x 2 days.  Felt similar to prior ovarian cyst pain. Has intact appendix.     Fayrene Helper, PA-C 08/26/21 1438    Terald Sleeper, MD 08/26/21 [phone]

## 2021-08-26 NOTE — ED Provider Notes (Signed)
[facility] EMERGENCY DEPARTMENT Provider Note   CSN: [id number] Arrival date & time: 08/26/21  1256     History Chief Complaint  Patient presents with   Abdominal Pain    Hannah Pearson is a 25 y.o. female with history of large right ovarian cyst removal in August 2022, presented to ED with right lower quadrant abdominal pain.  The patient reports onset of symptoms gradually 2 days ago.  She has persistent pain is worse when she is standing, walking, pushing on her belly, and better when she is at rest.  The pain is localized in the right lower abdomen in the right upper pelvis region.  She says it feels like "mild cyst".  She reports her last menstrual period was a month ago.  She denies vaginal bleeding or discharge.  She denies dysuria.  She denies nausea, vomiting, diarrhea.  She denies any other history of abdominal surgery.  Per medical record review, the patient did have a history of a laparoscopic ovarian cystectomy performed on 05/23/21 by Dr Almon Register, DO, she was noted to have extensive omental adhesions.  The adhesions were taken down.  The right ovarian cyst was removed per the operative report.  HPI     Past Medical History:  Diagnosis Date   Gastritis    GBS (group B Streptococcus carrier), +RV culture, currently pregnant 01/29/2019   History of prior pregnancy with IUGR newborn 08/26/2018   IOL at 38 weeks by pt history d/t to IUGR Wt 5 # 14 oz Records requested   Ovarian cyst     Patient Active Problem List   Diagnosis Date Noted   Language barrier 01/25/2019    Past Surgical History:  Procedure Laterality Date   CESAREAN SECTION  02/05/2019   Procedure: CESAREAN SECTION;  Surgeon: Allie Bossier, MD;  Location: [facility] LD ORS;  Service: Obstetrics;;   WOUND EXPLORATION N/A 02/05/2019   Procedure: WOUND EXPLORATION;  Surgeon: Allie Bossier, MD;  Location: [facility] LD ORS;  Service: Gynecology;  Laterality: N/A;     OB History      Gravida  2   Para  2   Term  2   Preterm  0   AB  0   Living  2      SAB  0   IAB  0   Ectopic  0   Multiple  0   Live Births  1           No family history on file.  Social History   Tobacco Use   Smoking status: Never   Smokeless tobacco: Never  Vaping Use   Vaping Use: Never used  Substance Use Topics   Alcohol use: Not Currently   Drug use: Not Currently    Home Medications Prior to Admission medications   Medication Sig Start Date End Date Taking? Authorizing Provider  ibuprofen (ADVIL) 600 MG tablet Take 1 tablet (600 mg total) by mouth every 6 (six) hours as needed for up to 30 doses for moderate pain or mild pain. 08/26/21  Yes Edinson Domeier, Kermit Balo, MD  cephALEXin (KEFLEX) 500 MG capsule Take 1 capsule (500 mg total) by mouth 2 (two) times daily. 06/25/19   Roxy Horseman, PA-C  ibuprofen (ADVIL) 800 MG tablet Take 1 tablet (800 mg total) by mouth 3 (three) times daily. Patient not taking: Reported on 03/08/2019 02/07/19   Tamera Stands, DO  ondansetron (ZOFRAN ODT) 4 MG disintegrating tablet Take 1  tablet (4 mg total) by mouth every 8 (eight) hours as needed for nausea or vomiting. 06/25/19   Montine Circle, PA-C  Prenatal Vit-Fe Fumarate-FA (PREPLUS) 27-1 MG TABS Take 1 tablet by mouth daily. 12/16/18   Donnamae Jude, MD    Allergies    Patient has no known allergies.  Review of Systems   Review of Systems  Constitutional:  Negative for chills and fever.  Respiratory:  Negative for cough and shortness of breath.   Cardiovascular:  Negative for chest pain and palpitations.  Gastrointestinal:  Positive for abdominal pain. Negative for diarrhea, nausea and vomiting.  Genitourinary:  Positive for pelvic pain. Negative for dysuria, hematuria, vaginal bleeding, vaginal discharge and vaginal pain.  Musculoskeletal:  Negative for arthralgias and back pain.  Skin:  Negative for color change and rash.  Neurological:  Negative for syncope and  headaches.  All other systems reviewed and are negative.  Physical Exam Updated Vital Signs BP 128/78 (BP Location: Right Arm)   Pulse 71   Temp 98.1 F (36.7 C) (Oral)   Resp 14   LMP 07/27/2021   SpO2 99%   Physical Exam Constitutional:      General: She is not in acute distress. HENT:     Head: Normocephalic and atraumatic.  Eyes:     Conjunctiva/sclera: Conjunctivae normal.     Pupils: Pupils are equal, round, and reactive to light.  Cardiovascular:     Rate and Rhythm: Normal rate and regular rhythm.  Pulmonary:     Effort: Pulmonary effort is normal. No respiratory distress.  Abdominal:     General: There is no distension.     Tenderness: There is abdominal tenderness. There is no guarding. Positive signs include McBurney's sign. Negative signs include Murphy's sign.  Skin:    General: Skin is warm and dry.  Neurological:     General: No focal deficit present.     Mental Status: She is alert. Mental status is at baseline.  Psychiatric:        Mood and Affect: Mood normal.        Behavior: Behavior normal.    ED Results / Procedures / Treatments   Labs (all labs ordered are listed, but only abnormal results are displayed) Labs Reviewed  CBC WITH DIFFERENTIAL/PLATELET - Abnormal; Notable for the following components:      Result Value   WBC 10.8 (*)    All other components within normal limits  COMPREHENSIVE METABOLIC PANEL - Abnormal; Notable for the following components:   AST 14 (*)    All other components within normal limits  URINALYSIS, ROUTINE W REFLEX MICROSCOPIC - Abnormal; Notable for the following components:   APPearance CLOUDY (*)    All other components within normal limits  LIPASE, BLOOD  I-STAT BETA HCG BLOOD, ED ([facility], [facility], [facility] ONLY)    EKG None  Radiology CT ABDOMEN PELVIS W CONTRAST  Result Date: 08/26/2021 CLINICAL DATA:  C/o RLQ pain x 2 days with dysuria. Denies nausea, vomiting, and diarrhea. States she had surgery in August for an  ovarian cyst and the pain is similar. EXAM: CT ABDOMEN AND PELVIS WITH CONTRAST TECHNIQUE: Multidetector CT imaging of the abdomen and pelvis was performed using the standard protocol following bolus administration of intravenous contrast. CONTRAST:  187mL OMNIPAQUE IOHEXOL 350 MG/ML SOLN COMPARISON:  None. FINDINGS: Lower chest: Clear lung bases. Hepatobiliary: No focal liver abnormality is seen. No gallstones, gallbladder wall thickening, or biliary dilatation. Pancreas: Unremarkable. No pancreatic ductal dilatation  or surrounding inflammatory changes. Spleen: Normal in size without focal abnormality. Adrenals/Urinary Tract: Adrenal glands are unremarkable. Kidneys are normal, without renal calculi, focal lesion, or hydronephrosis. Bladder is unremarkable. Stomach/Bowel: Stomach is within normal limits. Appendix appears normal. No evidence of bowel wall thickening, distention, or inflammatory changes. Vascular/Lymphatic: No significant vascular findings are present. No enlarged abdominal or pelvic lymph nodes. Reproductive: Normal uterus. Right ovarian/adnexal cystic mass, 4.7 cm in size. No left adnexal abnormality. Other: No abdominal wall hernia or abnormality. No abdominopelvic ascites. Musculoskeletal: No acute or significant osseous findings. IMPRESSION: 1. Right ovarian/adnexal cystic mass, likely a physiologic ovarian cyst, measuring 4.7 cm. Remainder of the exam is within normal limits. A normal appendix is visualized. Consider follow-up pelvic ultrasound or further assessment since there are no other findings to account for right lower quadrant pain. Electronically Signed   By: Lajean Manes M.D.   On: 08/26/2021 19:05    Procedures Procedures   Medications Ordered in ED Medications  ketorolac (TORADOL) 30 MG/ML injection 30 mg (30 mg Intravenous Given 08/26/21 1733)  iohexol (OMNIPAQUE) 350 MG/ML injection 100 mL (100 mLs Intravenous Contrast Given 08/26/21 1752)    ED Course  I have  reviewed the triage vital signs and the nursing notes.  Pertinent labs & imaging results that were available during my care of the patient were reviewed by me and considered in my medical decision making (see chart for details).  Differential diagnosis for this patient symptoms would include acute appendicitis versus recurring ovarian cyst versus ovarian torsion (less likely given her symptoms are quite benign and she appears comfortable) versus cystitis versus other.  I personally reviewed her outpatient medical records including operative report from her right-sided cystectomy.  I personally reviewed & interpreted her labs today showing a very minor leukocytosis with a white blood cell count of 10.7.  Otherwise her labs and her UA are unremarkable.  Pregnancy is negative.  Will place an IV, I ordered CT of the abdomen pelvis with contrast, will give some Toradol for pain.    Clinical Course as of 08/26/21 2115  Nancy Fetter Aug 26, 2021  1912 Patient appears to have recurrence of her cyst.  She is pain-free after Toradol.  I will discharge her with Motrin and advised that she contact her surgeons to perform the laparoscopic cyst removal previously.  She verbalized understanding. [MT]    Clinical Course User Index [MT] Skylah Delauter, Carola Rhine, MD   Final Clinical Impression(s) / ED Diagnoses Final diagnoses:  Cyst of right ovary    Rx / DC Orders ED Discharge Orders          Ordered    ibuprofen (ADVIL) 600 MG tablet  Every 6 hours PRN        08/26/21 1914             Wyvonnia Dusky, MD 08/26/21 2115

## 2021-08-27 DIAGNOSIS — E559 Vitamin D deficiency, unspecified: Secondary | ICD-10-CM | POA: Diagnosis not present

## 2021-08-27 DIAGNOSIS — Z6828 Body mass index (BMI) 28.0-28.9, adult: Secondary | ICD-10-CM | POA: Diagnosis not present

## 2021-08-27 DIAGNOSIS — N390 Urinary tract infection, site not specified: Secondary | ICD-10-CM | POA: Diagnosis not present

## 2021-08-27 DIAGNOSIS — N83201 Unspecified ovarian cyst, right side: Secondary | ICD-10-CM | POA: Diagnosis not present

## 2021-08-27 DIAGNOSIS — N63 Unspecified lump in unspecified breast: Secondary | ICD-10-CM | POA: Diagnosis not present

## 2022-02-20 DIAGNOSIS — E559 Vitamin D deficiency, unspecified: Secondary | ICD-10-CM | POA: Diagnosis not present

## 2022-02-20 DIAGNOSIS — Z6827 Body mass index (BMI) 27.0-27.9, adult: Secondary | ICD-10-CM | POA: Diagnosis not present

## 2022-02-20 DIAGNOSIS — M545 Low back pain, unspecified: Secondary | ICD-10-CM | POA: Diagnosis not present

## 2022-02-20 DIAGNOSIS — N83201 Unspecified ovarian cyst, right side: Secondary | ICD-10-CM | POA: Diagnosis not present

## 2022-02-20 DIAGNOSIS — N390 Urinary tract infection, site not specified: Secondary | ICD-10-CM | POA: Diagnosis not present

## 2022-02-20 DIAGNOSIS — M542 Cervicalgia: Secondary | ICD-10-CM | POA: Diagnosis not present

## 2022-03-17 DIAGNOSIS — E663 Overweight: Secondary | ICD-10-CM | POA: Diagnosis not present

## 2022-03-17 DIAGNOSIS — E559 Vitamin D deficiency, unspecified: Secondary | ICD-10-CM | POA: Diagnosis not present

## 2022-03-17 DIAGNOSIS — M545 Low back pain, unspecified: Secondary | ICD-10-CM | POA: Diagnosis not present

## 2022-03-17 DIAGNOSIS — N83201 Unspecified ovarian cyst, right side: Secondary | ICD-10-CM | POA: Diagnosis not present

## 2022-03-17 DIAGNOSIS — Z6827 Body mass index (BMI) 27.0-27.9, adult: Secondary | ICD-10-CM | POA: Diagnosis not present

## 2022-03-17 DIAGNOSIS — Z0000 Encounter for general adult medical examination without abnormal findings: Secondary | ICD-10-CM | POA: Diagnosis not present

## 2022-03-28 DIAGNOSIS — R0602 Shortness of breath: Secondary | ICD-10-CM | POA: Diagnosis not present

## 2022-03-28 DIAGNOSIS — E663 Overweight: Secondary | ICD-10-CM | POA: Diagnosis not present

## 2022-03-28 DIAGNOSIS — R5383 Other fatigue: Secondary | ICD-10-CM | POA: Diagnosis not present

## 2022-03-28 DIAGNOSIS — Z3200 Encounter for pregnancy test, result unknown: Secondary | ICD-10-CM | POA: Diagnosis not present

## 2022-03-28 DIAGNOSIS — Z6829 Body mass index (BMI) 29.0-29.9, adult: Secondary | ICD-10-CM | POA: Diagnosis not present

## 2022-04-12 DIAGNOSIS — N83201 Unspecified ovarian cyst, right side: Secondary | ICD-10-CM | POA: Diagnosis not present

## 2022-04-12 DIAGNOSIS — N854 Malposition of uterus: Secondary | ICD-10-CM | POA: Diagnosis not present

## 2022-04-12 DIAGNOSIS — Z9889 Other specified postprocedural states: Secondary | ICD-10-CM | POA: Diagnosis not present

## 2022-04-12 DIAGNOSIS — N83202 Unspecified ovarian cyst, left side: Secondary | ICD-10-CM | POA: Diagnosis not present

## 2022-04-12 DIAGNOSIS — Z3046 Encounter for surveillance of implantable subdermal contraceptive: Secondary | ICD-10-CM | POA: Diagnosis not present
